# Patient Record
Sex: Female | Born: 1980 | Race: White | Hispanic: No | Marital: Married | Smoking: Never smoker
Health system: Southern US, Community
[De-identification: ages and names within clinical notes are randomized; demographics above are authoritative.]

## PROBLEM LIST (undated history)

## (undated) DIAGNOSIS — R112 Nausea with vomiting, unspecified: Secondary | ICD-10-CM

## (undated) DIAGNOSIS — E039 Hypothyroidism, unspecified: Secondary | ICD-10-CM

## (undated) DIAGNOSIS — N809 Endometriosis, unspecified: Secondary | ICD-10-CM

## (undated) DIAGNOSIS — Z9889 Other specified postprocedural states: Secondary | ICD-10-CM

## (undated) HISTORY — PX: CYSTECTOMY: SUR359

## (undated) HISTORY — PX: KNEE SURGERY: SHX244

## (undated) HISTORY — PX: HERNIA REPAIR: SHX51

---

## 2016-04-30 LAB — OB RESULTS CONSOLE ANTIBODY SCREEN: ANTIBODY SCREEN: NEGATIVE

## 2016-04-30 LAB — OB RESULTS CONSOLE ABO/RH: RH TYPE: POSITIVE

## 2016-04-30 LAB — OB RESULTS CONSOLE GC/CHLAMYDIA
Chlamydia: NEGATIVE
Gonorrhea: NEGATIVE

## 2016-04-30 LAB — OB RESULTS CONSOLE HIV ANTIBODY (ROUTINE TESTING): HIV: NONREACTIVE

## 2016-04-30 LAB — OB RESULTS CONSOLE RPR: RPR: NONREACTIVE

## 2016-04-30 LAB — OB RESULTS CONSOLE HEPATITIS B SURFACE ANTIGEN: HEP B S AG: NEGATIVE

## 2016-04-30 LAB — OB RESULTS CONSOLE RUBELLA ANTIBODY, IGM: RUBELLA: IMMUNE

## 2016-06-30 LAB — OB RESULTS CONSOLE GBS: STREP GROUP B AG: NEGATIVE

## 2016-07-14 ENCOUNTER — Encounter (HOSPITAL_COMMUNITY): Payer: Self-pay | Admitting: *Deleted

## 2016-07-21 NOTE — H&P (Addendum)
HPI: 35 y/o [id number] @ 172w4d estimated gestational age (as dated by LMP c/w 20 week ultrasound) presents for scheduled C-section and bilateral salpingectomy.   no Leaking of Fluid,   no Vaginal Bleeding,   no Uterine Contractions,  + Fetal Movement.  ROS: no HA, no epigastric pain, no visual changes.    Pregnancy complicated by: 1) GDMA1- well controlled with diet -followed by NST weekly -last US @ 34wk- vertex/EFW: 5#13oz (79%)    Prenatal Transfer Tool  Maternal Diabetes: Yes:  Diabetes Type:  Diet controlled Genetic Screening: Normal Maternal Ultrasounds/Referrals: Normal Fetal Ultrasounds or other Referrals:  None Maternal Substance Abuse:  No Significant Maternal Medications:  None Significant Maternal Lab Results: Lab values include: Group B Strep positive   PNL:  GBS neg, Rub Immune, Hep B neg, RPR NR, HIV neg, GC/C neg, glucola:abnormal Blood type: A positive antibody neg  Immunizations: Tdap: 06/24/16 Flu: 10/18  OBHx:  2011- Primary C-section, gestational HTN, 7#14oz 2013- SAB 2014- Repeat C-section, GDMA1, 8#10oz  PMHx:  Gestational DM and gestational HTN Meds:  PNV Allergy:   Allergies  Allergen Reactions  . Adhesive [Tape]   . Percocet [Oxycodone-Acetaminophen] Nausea And Vomiting   SurgHx: C-section x 2 SocHx:   no Tobacco, no  EtOH, no Illicit Drugs  O: Examination performed in office Gen. AAOx3, NAD CV.  RRR  Resp. Normal respiratory effort Abd. Gravid,  no tenderness,  no rigidity,  no guarding Extr.  no edema B/L , no calf tenderness bilaterally  FHT: NST reactive on 11/30   Labs: see orders  A/P:  35 y.o. [id number] @ 3172w4d EGA who presents for scheduled repeat C-section and bilateral salpingectomy -FWB:  Reassuring by doppler/NST -GDMA1- well controlled with diet, accucheck upon arrival -NPO -LR @ 125cc/hr -Ancef 2g IV to OR -SCDs to OR  Risk benefits and alternatives of cesarean section were discussed with the patient including but not  limited to infection, bleeding, damage to bowel , bladder and baby with the need for further surgery. Pt voiced understanding and desires to proceed.  Bilateral tubal ligation reviewed with R&B including but not limited to bleeding, infection, injury to other organs, irreversibility.  Questions answered.    Myna HidalgoJennifer Margeart Allender, DO [phone] (pager) [phone] (office)

## 2016-07-22 ENCOUNTER — Encounter (HOSPITAL_COMMUNITY)
Admission: RE | Admit: 2016-07-22 | Discharge: 2016-07-22 | Disposition: A | Discharge status: Home / Self Care | Payer: BLUE CROSS/BLUE SHIELD | Source: Ambulatory Visit | Attending: Obstetrics & Gynecology | Admitting: Obstetrics & Gynecology

## 2016-07-22 HISTORY — DX: Nausea with vomiting, unspecified: R11.2

## 2016-07-22 HISTORY — DX: Endometriosis, unspecified: N80.9

## 2016-07-22 HISTORY — DX: Other specified postprocedural states: Z98.890

## 2016-07-22 HISTORY — DX: Hypothyroidism, unspecified: E03.9

## 2016-07-22 LAB — BASIC METABOLIC PANEL
ANION GAP: 6 (ref 5–15)
BUN: 9 mg/dL (ref 6–20)
CHLORIDE: 103 mmol/L (ref 101–111)
CO2: 25 mmol/L (ref 22–32)
Calcium: 9.2 mg/dL (ref 8.9–10.3)
Creatinine, Ser: 0.57 mg/dL (ref 0.44–1.00)
GFR calc Af Amer: >60 mL/min (ref >60)
GFR calc non Af Amer: >60 mL/min (ref >60)
GLUCOSE: 108 mg/dL — AB (ref 65–99)
POTASSIUM: 4.3 mmol/L (ref 3.5–5.1)
SODIUM: 134 mmol/L — AB (ref 135–145)

## 2016-07-22 LAB — CBC
HCT: 34.5 % — ABNORMAL LOW (ref 36.0–46.0)
HEMOGLOBIN: 11.4 g/dL — AB (ref 12.0–15.0)
MCH: 27.9 pg (ref 26.0–34.0)
MCHC: 33 g/dL (ref 30.0–36.0)
MCV: 84.6 fL (ref 78.0–100.0)
Platelets: 186 10*3/uL (ref 150–400)
RBC: 4.08 MIL/uL (ref 3.87–5.11)
RDW: 15.3 % (ref 11.5–15.5)
WBC: 7.4 10*3/uL (ref 4.0–10.5)

## 2016-07-22 LAB — ABO/RH: ABO/RH(D): A POS

## 2016-07-22 NOTE — Patient Instructions (Signed)
[address] LawmanShannon Lunz  07/22/2016   Your procedure is scheduled on:  07/24/2016  [facility] through the [facility] of [facility] at 0845 AM.  Pick up the phone at the desk and dial [phone].   Call this number if you have problems the morning of surgery: [phone]   Remember:   Do not eat food:After Midnight.  Do not drink clear liquids: After Midnight.  Take these medicines the morning of surgery with A SIP OF WATER: synthroid   Do not wear jewelry, make-up or nail polish.  Do not wear lotions, powders, or perfumes. Do not wear deodorant.  Do not shave 48 hours prior to surgery.  Do not bring valuables to the hospital.  [facility] not   responsible for any belongings or valuables brought to the hospital.  Contacts, dentures or bridgework may not be worn into surgery.  Leave suitcase in the car. After surgery it may be brought to your room.  For patients admitted to the hospital, checkout time is 11:00 AM the day of              discharge.   Patients discharged the day of surgery will not be allowed to drive             home.  Name and phone number of your driver: na  Special Instructions:   N/A   Please read over the following fact sheets that you were given:   Surgical Site Infection Prevention

## 2016-07-23 ENCOUNTER — Encounter (HOSPITAL_COMMUNITY): Payer: BLUE CROSS/BLUE SHIELD

## 2016-07-23 LAB — RPR: RPR Ser Ql: NONREACTIVE

## 2016-07-24 ENCOUNTER — Encounter (HOSPITAL_COMMUNITY): Payer: Self-pay | Admitting: Anesthesiology

## 2016-07-24 ENCOUNTER — Inpatient Hospital Stay (HOSPITAL_COMMUNITY): Payer: BLUE CROSS/BLUE SHIELD | Admitting: Anesthesiology

## 2016-07-24 ENCOUNTER — Inpatient Hospital Stay (HOSPITAL_COMMUNITY)
Admission: RE | Admit: 2016-07-24 | Discharge: 2016-07-26 | DRG: 766 | Disposition: A | Discharge status: Home / Self Care | Payer: BLUE CROSS/BLUE SHIELD | Source: Ambulatory Visit | Attending: Obstetrics & Gynecology | Admitting: Obstetrics & Gynecology

## 2016-07-24 ENCOUNTER — Encounter (HOSPITAL_COMMUNITY)
Admission: RE | Disposition: A | Discharge status: Home / Self Care | Payer: Self-pay | Source: Ambulatory Visit | Attending: Obstetrics & Gynecology

## 2016-07-24 DIAGNOSIS — Z3490 Encounter for supervision of normal pregnancy, unspecified, unspecified trimester: Secondary | ICD-10-CM

## 2016-07-24 DIAGNOSIS — L259 Unspecified contact dermatitis, unspecified cause: Secondary | ICD-10-CM | POA: Diagnosis present

## 2016-07-24 DIAGNOSIS — Z302 Encounter for sterilization: Secondary | ICD-10-CM | POA: Diagnosis not present

## 2016-07-24 DIAGNOSIS — Z3493 Encounter for supervision of normal pregnancy, unspecified, third trimester: Secondary | ICD-10-CM

## 2016-07-24 DIAGNOSIS — O99284 Endocrine, nutritional and metabolic diseases complicating childbirth: Secondary | ICD-10-CM | POA: Diagnosis present

## 2016-07-24 DIAGNOSIS — Z3A39 39 weeks gestation of pregnancy: Secondary | ICD-10-CM | POA: Diagnosis not present

## 2016-07-24 DIAGNOSIS — O134 Gestational [pregnancy-induced] hypertension without significant proteinuria, complicating childbirth: Secondary | ICD-10-CM | POA: Diagnosis present

## 2016-07-24 DIAGNOSIS — E039 Hypothyroidism, unspecified: Secondary | ICD-10-CM | POA: Diagnosis present

## 2016-07-24 DIAGNOSIS — O24420 Gestational diabetes mellitus in childbirth, diet controlled: Secondary | ICD-10-CM | POA: Diagnosis present

## 2016-07-24 DIAGNOSIS — O99824 Streptococcus B carrier state complicating childbirth: Secondary | ICD-10-CM | POA: Diagnosis present

## 2016-07-24 DIAGNOSIS — O34211 Maternal care for low transverse scar from previous cesarean delivery: Secondary | ICD-10-CM | POA: Diagnosis present

## 2016-07-24 HISTORY — PX: TUBAL LIGATION: SHX77

## 2016-07-24 LAB — GLUCOSE, CAPILLARY
GLUCOSE-CAPILLARY: 93 mg/dL (ref 65–99)
Glucose-Capillary: 85 mg/dL (ref 65–99)

## 2016-07-24 LAB — PREPARE RBC (CROSSMATCH)

## 2016-07-24 SURGERY — Surgical Case
Anesthesia: Spinal | Site: Abdomen

## 2016-07-24 MED ORDER — KETOROLAC TROMETHAMINE 30 MG/ML IJ SOLN
30.0000 mg | Freq: Four times a day (QID) | INTRAMUSCULAR | Status: AC | PRN
  Administered 2016-07-24: 30 mg via INTRAVENOUS
  Filled 2016-07-24: qty 1

## 2016-07-24 MED ORDER — ONDANSETRON HCL 4 MG/2ML IJ SOLN
INTRAMUSCULAR | Status: AC
  Filled 2016-07-24: qty 2

## 2016-07-24 MED ORDER — NALBUPHINE HCL 10 MG/ML IJ SOLN: 5.0000 mg | Freq: Once | INTRAMUSCULAR | Status: DC | PRN

## 2016-07-24 MED ORDER — METOCLOPRAMIDE HCL 5 MG/ML IJ SOLN
INTRAMUSCULAR | Status: AC
  Filled 2016-07-24: qty 2

## 2016-07-24 MED ORDER — KETOROLAC TROMETHAMINE 30 MG/ML IJ SOLN
30.0000 mg | Freq: Four times a day (QID) | INTRAMUSCULAR | Status: AC | PRN
  Administered 2016-07-24: 30 mg via INTRAMUSCULAR

## 2016-07-24 MED ORDER — FENTANYL CITRATE (PF) 100 MCG/2ML IJ SOLN: 25.0000 ug | INTRAMUSCULAR | Status: DC | PRN

## 2016-07-24 MED ORDER — NALOXONE HCL 0.4 MG/ML IJ SOLN: 0.4000 mg | INTRAMUSCULAR | Status: DC | PRN

## 2016-07-24 MED ORDER — SIMETHICONE 80 MG PO CHEW
80.0000 mg | CHEWABLE_TABLET | Freq: Three times a day (TID) | ORAL | Status: DC
  Administered 2016-07-24 – 2016-07-26 (×6): 80 mg via ORAL
  Filled 2016-07-24 (×6): qty 1

## 2016-07-24 MED ORDER — NALBUPHINE HCL 10 MG/ML IJ SOLN: 5.0000 mg | INTRAMUSCULAR | Status: DC | PRN

## 2016-07-24 MED ORDER — KETOROLAC TROMETHAMINE 30 MG/ML IJ SOLN
INTRAMUSCULAR | Status: AC
  Filled 2016-07-24: qty 1

## 2016-07-24 MED ORDER — PRENATAL MULTIVITAMIN CH
1.0000 | ORAL_TABLET | Freq: Every day | ORAL | Status: DC
  Administered 2016-07-25 – 2016-07-26 (×2): 1 via ORAL
  Filled 2016-07-24 (×2): qty 1

## 2016-07-24 MED ORDER — METOCLOPRAMIDE HCL 5 MG/ML IJ SOLN
INTRAMUSCULAR | Status: DC | PRN
  Administered 2016-07-24 (×2): 5 mg via INTRAVENOUS

## 2016-07-24 MED ORDER — LEVOTHYROXINE SODIUM 75 MCG PO TABS
37.5000 ug | ORAL_TABLET | Freq: Every day | ORAL | Status: DC
  Administered 2016-07-25 – 2016-07-26 (×2): 37.5 ug via ORAL
  Filled 2016-07-24 (×3): qty 0.5

## 2016-07-24 MED ORDER — IBUPROFEN 600 MG PO TABS
600.0000 mg | ORAL_TABLET | Freq: Four times a day (QID) | ORAL | Status: DC | PRN
  Administered 2016-07-24: 600 mg via ORAL

## 2016-07-24 MED ORDER — ZOLPIDEM TARTRATE 5 MG PO TABS: 5.0000 mg | ORAL_TABLET | Freq: Every evening | ORAL | Status: DC | PRN

## 2016-07-24 MED ORDER — SIMETHICONE 80 MG PO CHEW
80.0000 mg | CHEWABLE_TABLET | ORAL | Status: DC
  Administered 2016-07-24 – 2016-07-25 (×2): 80 mg via ORAL
  Filled 2016-07-24 (×2): qty 1

## 2016-07-24 MED ORDER — OXYTOCIN 40 UNITS IN LACTATED RINGERS INFUSION - SIMPLE MED: 2.5000 [IU]/h | INTRAVENOUS | Status: AC

## 2016-07-24 MED ORDER — SCOPOLAMINE 1 MG/3DAYS TD PT72
1.0000 | MEDICATED_PATCH | Freq: Once | TRANSDERMAL | Status: DC
  Administered 2016-07-24: 1.5 mg via TRANSDERMAL

## 2016-07-24 MED ORDER — DIPHENHYDRAMINE HCL 50 MG/ML IJ SOLN: 12.5000 mg | INTRAMUSCULAR | Status: DC | PRN

## 2016-07-24 MED ORDER — FENTANYL CITRATE (PF) 100 MCG/2ML IJ SOLN
INTRAMUSCULAR | Status: AC
  Filled 2016-07-24: qty 2

## 2016-07-24 MED ORDER — PHENYLEPHRINE 8 MG IN D5W 100 ML (0.08MG/ML) PREMIX OPTIME
INJECTION | INTRAVENOUS | Status: AC
  Filled 2016-07-24: qty 100

## 2016-07-24 MED ORDER — SENNOSIDES-DOCUSATE SODIUM 8.6-50 MG PO TABS
2.0000 | ORAL_TABLET | ORAL | Status: DC
  Administered 2016-07-24 – 2016-07-25 (×2): 2 via ORAL
  Filled 2016-07-24 (×2): qty 2

## 2016-07-24 MED ORDER — MEPERIDINE HCL 25 MG/ML IJ SOLN
INTRAMUSCULAR | Status: AC
  Filled 2016-07-24: qty 1

## 2016-07-24 MED ORDER — LACTATED RINGERS IV SOLN: INTRAVENOUS | Status: DC

## 2016-07-24 MED ORDER — IBUPROFEN 600 MG PO TABS
600.0000 mg | ORAL_TABLET | Freq: Four times a day (QID) | ORAL | Status: DC
  Administered 2016-07-25 – 2016-07-26 (×6): 600 mg via ORAL
  Filled 2016-07-24 (×7): qty 1

## 2016-07-24 MED ORDER — DIPHENHYDRAMINE HCL 25 MG PO CAPS
25.0000 mg | ORAL_CAPSULE | ORAL | Status: DC | PRN
  Administered 2016-07-25 – 2016-07-26 (×3): 25 mg via ORAL
  Filled 2016-07-24 (×3): qty 1

## 2016-07-24 MED ORDER — MEPERIDINE HCL 25 MG/ML IJ SOLN: 6.2500 mg | INTRAMUSCULAR | Status: DC | PRN

## 2016-07-24 MED ORDER — LACTATED RINGERS IV SOLN
INTRAVENOUS | Status: DC
  Administered 2016-07-24 (×2): via INTRAVENOUS

## 2016-07-24 MED ORDER — ACETAMINOPHEN 325 MG PO TABS
650.0000 mg | ORAL_TABLET | ORAL | Status: DC | PRN
  Administered 2016-07-25 (×2): 650 mg via ORAL
  Filled 2016-07-24 (×3): qty 2

## 2016-07-24 MED ORDER — DIPHENHYDRAMINE HCL 50 MG/ML IJ SOLN
INTRAMUSCULAR | Status: AC
  Administered 2016-07-24: 12.5 mg via INTRAVENOUS
  Filled 2016-07-24: qty 1

## 2016-07-24 MED ORDER — NALOXONE HCL 2 MG/2ML IJ SOSY
1.0000 ug/kg/h | PREFILLED_SYRINGE | INTRAVENOUS | Status: DC | PRN
  Filled 2016-07-24: qty 2

## 2016-07-24 MED ORDER — FENTANYL CITRATE (PF) 100 MCG/2ML IJ SOLN
INTRAMUSCULAR | Status: DC | PRN
Start: 2016-07-24 — End: 2016-07-24
  Administered 2016-07-24: 20 ug via INTRATHECAL

## 2016-07-24 MED ORDER — MORPHINE SULFATE (PF) 0.5 MG/ML IJ SOLN
INTRAMUSCULAR | Status: DC | PRN
  Administered 2016-07-24: .2 mg via INTRATHECAL

## 2016-07-24 MED ORDER — PHENYLEPHRINE 8 MG IN D5W 100 ML (0.08MG/ML) PREMIX OPTIME
INJECTION | INTRAVENOUS | Status: DC | PRN
  Administered 2016-07-24: 40 ug/min via INTRAVENOUS

## 2016-07-24 MED ORDER — PHENYLEPHRINE HCL 10 MG/ML IJ SOLN
INTRAMUSCULAR | Status: DC | PRN
  Administered 2016-07-24: 40 ug via INTRAVENOUS

## 2016-07-24 MED ORDER — BUPIVACAINE IN DEXTROSE 0.75-8.25 % IT SOLN
INTRATHECAL | Status: DC | PRN
  Administered 2016-07-24: 1.5 mL via INTRATHECAL

## 2016-07-24 MED ORDER — MEPERIDINE HCL 25 MG/ML IJ SOLN
INTRAMUSCULAR | Status: DC | PRN
  Administered 2016-07-24 (×2): 12.5 mg via INTRAVENOUS

## 2016-07-24 MED ORDER — MENTHOL 3 MG MT LOZG: 1.0000 | LOZENGE | OROMUCOSAL | Status: DC | PRN

## 2016-07-24 MED ORDER — OXYTOCIN 10 UNIT/ML IJ SOLN
INTRAMUSCULAR | Status: AC
  Filled 2016-07-24: qty 4

## 2016-07-24 MED ORDER — SIMETHICONE 80 MG PO CHEW: 80.0000 mg | CHEWABLE_TABLET | ORAL | Status: DC | PRN

## 2016-07-24 MED ORDER — SCOPOLAMINE 1 MG/3DAYS TD PT72
MEDICATED_PATCH | TRANSDERMAL | Status: AC
  Administered 2016-07-24: 1.5 mg via TRANSDERMAL
  Filled 2016-07-24: qty 1

## 2016-07-24 MED ORDER — DIPHENHYDRAMINE HCL 25 MG PO CAPS
25.0000 mg | ORAL_CAPSULE | Freq: Four times a day (QID) | ORAL | Status: DC | PRN
  Administered 2016-07-24: 25 mg via ORAL
  Filled 2016-07-24 (×2): qty 1

## 2016-07-24 MED ORDER — ONDANSETRON HCL 4 MG/2ML IJ SOLN: 4.0000 mg | Freq: Three times a day (TID) | INTRAMUSCULAR | Status: DC | PRN

## 2016-07-24 MED ORDER — ONDANSETRON HCL 4 MG/2ML IJ SOLN
INTRAMUSCULAR | Status: DC | PRN
  Administered 2016-07-24: 4 mg via INTRAVENOUS

## 2016-07-24 MED ORDER — OXYTOCIN 10 UNIT/ML IJ SOLN
INTRAVENOUS | Status: DC | PRN
  Administered 2016-07-24: 40 [IU] via INTRAVENOUS

## 2016-07-24 MED ORDER — MORPHINE SULFATE-NACL 0.5-0.9 MG/ML-% IV SOSY
PREFILLED_SYRINGE | INTRAVENOUS | Status: AC
  Filled 2016-07-24: qty 1

## 2016-07-24 MED ORDER — DIBUCAINE 1 % RE OINT: 1.0000 "application " | TOPICAL_OINTMENT | RECTAL | Status: DC | PRN

## 2016-07-24 MED ORDER — CEFAZOLIN SODIUM-DEXTROSE 2-4 GM/100ML-% IV SOLN
2.0000 g | INTRAVENOUS | Status: AC
  Administered 2016-07-24: 2 g via INTRAVENOUS

## 2016-07-24 MED ORDER — LACTATED RINGERS IV SOLN
Freq: Once | INTRAVENOUS | Status: AC
  Administered 2016-07-24: 10:00:00 via INTRAVENOUS

## 2016-07-24 MED ORDER — COCONUT OIL OIL: 1.0000 "application " | TOPICAL_OIL | Status: DC | PRN

## 2016-07-24 MED ORDER — SODIUM CHLORIDE 0.9% FLUSH: 3.0000 mL | INTRAVENOUS | Status: DC | PRN

## 2016-07-24 MED ORDER — WITCH HAZEL-GLYCERIN EX PADS: 1.0000 "application " | MEDICATED_PAD | CUTANEOUS | Status: DC | PRN

## 2016-07-24 MED ORDER — TETANUS-DIPHTH-ACELL PERTUSSIS 5-2.5-18.5 LF-MCG/0.5 IM SUSP: 0.5000 mL | Freq: Once | INTRAMUSCULAR | Status: DC

## 2016-07-24 MED ORDER — LACTATED RINGERS IV SOLN
INTRAVENOUS | Status: DC
  Administered 2016-07-24 (×2): via INTRAVENOUS

## 2016-07-24 SURGICAL SUPPLY — 44 items
BARRIER ADHS 3X4 INTERCEED (GAUZE/BANDAGES/DRESSINGS) ×4 IMPLANT
BENZOIN TINCTURE PRP APPL 2/3 (GAUZE/BANDAGES/DRESSINGS) ×4 IMPLANT
BINDER ABD UNIV 9 30-45 (GAUZE/BANDAGES/DRESSINGS) ×2 IMPLANT
BINDER ABDOMINAL 9 (GAUZE/BANDAGES/DRESSINGS) ×4
CHLORAPREP W/TINT 26ML (MISCELLANEOUS) ×4 IMPLANT
CLAMP CORD UMBIL (MISCELLANEOUS) IMPLANT
CLOSURE WOUND 1/2 X4 (GAUZE/BANDAGES/DRESSINGS) ×1
CLOTH BEACON ORANGE TIMEOUT ST (SAFETY) ×4 IMPLANT
DERMABOND ADHESIVE PROPEN (GAUZE/BANDAGES/DRESSINGS) ×2
DERMABOND ADVANCED (GAUZE/BANDAGES/DRESSINGS)
DERMABOND ADVANCED .7 DNX12 (GAUZE/BANDAGES/DRESSINGS) IMPLANT
DERMABOND ADVANCED .7 DNX6 (GAUZE/BANDAGES/DRESSINGS) ×2 IMPLANT
DRSG OPSITE POSTOP 4X10 (GAUZE/BANDAGES/DRESSINGS) ×4 IMPLANT
ELECT REM PT RETURN 9FT ADLT (ELECTROSURGICAL) ×4
ELECTRODE REM PT RTRN 9FT ADLT (ELECTROSURGICAL) ×2 IMPLANT
EXTRACTOR VACUUM KIWI (MISCELLANEOUS) IMPLANT
GLOVE BIOGEL PI IND STRL 6.5 (GLOVE) ×2 IMPLANT
GLOVE BIOGEL PI IND STRL 7.0 (GLOVE) ×4 IMPLANT
GLOVE BIOGEL PI INDICATOR 6.5 (GLOVE) ×2
GLOVE BIOGEL PI INDICATOR 7.0 (GLOVE) ×4
GLOVE ECLIPSE 6.5 STRL STRAW (GLOVE) ×4 IMPLANT
GOWN STRL REUS W/TWL LRG LVL3 (GOWN DISPOSABLE) ×12 IMPLANT
HEMOSTAT ARISTA ABSORB 3G PWDR (MISCELLANEOUS) ×4 IMPLANT
KIT ABG SYR 3ML LUER SLIP (SYRINGE) IMPLANT
NEEDLE HYPO 25X5/8 SAFETYGLIDE (NEEDLE) IMPLANT
NS IRRIG 1000ML POUR BTL (IV SOLUTION) ×4 IMPLANT
PACK C SECTION WH (CUSTOM PROCEDURE TRAY) ×4 IMPLANT
PAD ABD 7.5X8 STRL (GAUZE/BANDAGES/DRESSINGS) ×4 IMPLANT
PAD OB MATERNITY 4.3X12.25 (PERSONAL CARE ITEMS) ×4 IMPLANT
PENCIL SMOKE EVAC W/HOLSTER (ELECTROSURGICAL) ×4 IMPLANT
RTRCTR C-SECT PINK 25CM LRG (MISCELLANEOUS) ×4 IMPLANT
SPONGE GAUZE 4X4 12PLY (GAUZE/BANDAGES/DRESSINGS) ×4 IMPLANT
STRIP CLOSURE SKIN 1/2X4 (GAUZE/BANDAGES/DRESSINGS) ×3 IMPLANT
SUT PLAIN 0 NONE (SUTURE) ×8 IMPLANT
SUT PLAIN 2 0 XLH (SUTURE) IMPLANT
SUT VIC AB 0 CT1 27 (SUTURE) ×4
SUT VIC AB 0 CT1 27XBRD ANBCTR (SUTURE) ×4 IMPLANT
SUT VIC AB 0 CTX 36 (SUTURE) ×6
SUT VIC AB 0 CTX36XBRD ANBCTRL (SUTURE) ×6 IMPLANT
SUT VIC AB 2-0 CT1 27 (SUTURE) ×4
SUT VIC AB 2-0 CT1 TAPERPNT 27 (SUTURE) ×4 IMPLANT
SUT VIC AB 4-0 KS 27 (SUTURE) ×4 IMPLANT
TOWEL OR 17X24 6PK STRL BLUE (TOWEL DISPOSABLE) ×4 IMPLANT
TRAY FOLEY CATH SILVER 14FR (SET/KITS/TRAYS/PACK) IMPLANT

## 2016-07-24 NOTE — Anesthesia Preprocedure Evaluation (Addendum)
Anesthesia Evaluation  Patient identified by MRN, date of birth, ID band Patient awake    Reviewed: Allergy & Precautions, NPO status , Patient's Chart, lab work & pertinent test results  History of Anesthesia Complications (+) PONV and history of anesthetic complications  Airway Mallampati: I  TM Distance: >3 FB Neck ROM: Full    Dental no notable dental hx. (+) Teeth Intact   Pulmonary neg pulmonary ROS,    Pulmonary exam normal breath sounds clear to auscultation       Cardiovascular negative cardio ROS Normal cardiovascular exam Rhythm:Regular Rate:Normal     Neuro/Psych negative neurological ROS  negative psych ROS   GI/Hepatic Neg liver ROS,   Endo/Other  Hypothyroidism   Renal/GU negative Renal ROS  negative genitourinary   Musculoskeletal negative musculoskeletal ROS (+)   Abdominal   Peds  Hematology  (+) anemia ,   Anesthesia Other Findings   Reproductive/Obstetrics (+) Pregnancy Previous C/Section x 2 Desires sterilization                            Lab Results  Component Value Date   WBC 7.4 07/22/2016   HGB 11.4 (L) 07/22/2016   HCT 34.5 (L) 07/22/2016   MCV 84.6 07/22/2016   PLT 186 07/22/2016    Anesthesia Physical Anesthesia Plan  ASA: II  Anesthesia Plan: Spinal   Post-op Pain Management:    Induction:   Airway Management Planned: Natural Airway  Additional Equipment:   Intra-op Plan:   Post-operative Plan:   Informed Consent: I have reviewed the patients History and Physical, chart, labs and discussed the procedure including the risks, benefits and alternatives for the proposed anesthesia with the patient or authorized representative who has indicated his/her understanding and acceptance.   Dental advisory given  Plan Discussed with: Anesthesiologist, CRNA and Surgeon  Anesthesia Plan Comments:         Anesthesia Quick Evaluation

## 2016-07-24 NOTE — Anesthesia Procedure Notes (Signed)
Spinal  Patient location during procedure: OR Start time: 07/24/2016 10:14 AM Staffing Anesthesiologist: Mal AmabileFOSTER, Aleni Andrus Performed: anesthesiologist  Preanesthetic Checklist Completed: patient identified, site marked, surgical consent, pre-op evaluation, timeout performed, IV checked, risks and benefits discussed and monitors and equipment checked Spinal Block Patient position: sitting Prep: site prepped and draped and DuraPrep Patient monitoring: heart rate, cardiac monitor, continuous pulse ox and blood pressure Approach: midline Location: L3-4 Injection technique: single-shot Needle Needle type: Pencan  Needle gauge: 24 G Needle length: 9 cm Needle insertion depth: 4 cm Assessment Sensory level: T4 Additional Notes Patient tolerated procedure well. Adequate sensory level.

## 2016-07-24 NOTE — Addendum Note (Signed)
Addendum  created 07/24/16 1920 by Yolonda KidaAlison L Arlyce Circle, CRNA   Sign clinical note

## 2016-07-24 NOTE — Transfer of Care (Signed)
Immediate Anesthesia Transfer of Care Note  Patient: Mikayla Black  Procedure(s) Performed: Procedure(s): CESAREAN SECTION WITH BILATERAL SALPINGECTOMY (N/A)  Patient Location: PACU  Anesthesia Type:Spinal  Level of Consciousness: awake, alert , oriented and patient cooperative  Airway & Oxygen Therapy: Patient Spontanous Breathing  Post-op Assessment: Report given to RN and Post -op Vital signs reviewed and stable  Post vital signs: Reviewed and stable  Last Vitals:  Vitals:   07/24/16 0920  BP: 126/89  Pulse: 97  Resp: 18  Temp: 36.9 C    Last Pain:  Vitals:   07/24/16 0920  TempSrc: Oral      Patients Stated Pain Goal: 3 (07/24/16 0920)  Complications: No apparent anesthesia complications

## 2016-07-24 NOTE — Interval H&P Note (Signed)
History and Physical Interval Note:  07/24/2016 9:50 AM  Mikayla Black  has presented today for surgery, with the diagnosis of  History of C-Section  The various methods of treatment have been discussed with the patient and family. After consideration of risks, benefits and other options for treatment, the patient has consented to  Procedure(s): CESAREAN SECTION WITH BILATERAL SALPINGECTOMY (N/A) as a surgical intervention .  The patient's history has been reviewed, patient examined, no change in status, stable for surgery.  I have reviewed the patient's chart and labs.  Questions were answered to the patient's satisfaction.     Myna HidalgoZAN, Willoughby Doell, M

## 2016-07-24 NOTE — Op Note (Signed)
PreOp Diagnosis: 1) Intrauterine pregnancy @ 5674w4d 2) GDMA1 3) Desire for permanent sterilization PostOp Diagnosis: same Procedure: Repeat LTCS and bilateral tubal ligation Surgeon: Dr. Myna HidalgoJennifer Barbaraann Avans Assistant: Dr. Marylou FlesherBenita Varnado Anesthesia: epidural Complications: none EBL: 800cc UOP: 200cc Fluids: 2400cc  Findings: Female infant from vertex presentation, anterior placenta.  Adhesions noted between the uterus and anterior abdominal wall, adnexal adhesions between the fallopian tubes ovaries and side wall.  PROCEDURE:  Informed consent was obtained from the patient with risks, benefits, complications, treatment options, and expected outcomes discussed with the patient.  The patient concurred with the proposed plan, giving informed consent with form signed.   The patient was taken to Operating Room, and identified with the procedure verified as C-Section Delivery with Time Out. With induction of anesthesia, the patient was prepped and draped in the usual sterile fashion. A Pfannenstiel incision was made and carried down through the subcutaneous tissue to the fascia. The fascia was incised in the midline and extended transversely. The superior aspect of the fascial incision was grasped with Kochers elevated and the underlying muscle dissected off. The inferior aspect of the facial incision was in similar fashion, grasped elevated and rectus muscles dissected off. The peritoneum was identified and entered. Peritoneal incision was extended longitudinally. The utero-vesical peritoneal reflection was identified and incised transversely with the [facility] sc[facility], the incision extended laterally, the bladder flap created digitally. The lower uterine segment was noted to be very thin.  A low transverse uterine incision was made and the infants head delivered atraumatically. After the umbilical cord was clamped and cut cord blood was obtained for evaluation.   The placenta was removed intact and appeared normal.  The uterine outline, tubes and ovaries were noted to have the adhesions as outlined above.  The uterine incision was closed with running locked sutures of 0 Vicryl.  Interrupted stitches were used to obtain hemostasis.    Attention was turned to the fallopian tubes.  Due to the adhesions, a salpingectomy was not able to be completed. Attention was turned to the right side, where the right fallopian tube was  grasped with Babcock forceps until the fimbria was visualized. Individual ties of 0 plain catgut suture were used to ligate a small portion of the fallopian tube.   Hemostasis was achieved with the bovie. An identical procedure was carried out on the opposite side. Again hemostasis was adequate. Attention was turned back to the uterus, due to the thin lower uterine segment a hematoma was noted.  The area was imbricated with interrupted 2-0 vicryl.  Adequate hemostasis was obtained. The pericolic gutters were then cleared of all clots and debris. Arista was placed as well as Interceed to prevent further adhesions.  The Alexis retractor was removed.  The fascia was closed using a running suture of 0 Vicryl. The skin was reapproximated using a subcuticular suture of 4-0 vicryl. Sponge, needle, and instrument counts were correct. The procedure is well tolerated by the patient who is taken to recovery room in a well and stable condition.  Specimen: 2 segments of tube sent to pathology.  Myna HidalgoJennifer Nadean Montanaro, DO [phone] (pager) [phone] (office)

## 2016-07-24 NOTE — Lactation Note (Signed)
This note was copied from a baby's chart. Lactation Consultation Note  Patient Name: Mikayla Black ZOXWR'UToday's Date: 07/24/2016 Reason for consult: Initial assessment   Initial consult with Exp BF mom of 1 hour old infant in PACU. Mom reports she BF her 3 and 346 yo for 1 year each.   Mom with small breasts with everted nipples. Infant was STS with mom. He was sleepy with minimal interest in feeding. He rooted briefly and suckled for a few sucks x 2, he then went back to sleep. Enc mom to feed infant STS 8-12 x in 24 hours at first feeding cues. Mom reports that she is aware of how to hand express.   Feeding log given with instructions for use. BF Resources Handout and LC Brochure given, mom informed of IP/OP Services, BF Support Groups and LC phone #. Enc mom to call out to desk for feeding assistance as needed.   Mom reports she has a Medela PIS at home for use. Follow up tomorrow and prn.   Maternal Data Formula Feeding for Exclusion: No Has patient been taught Hand Expression?: Yes Does the patient have breastfeeding experience prior to this delivery?: Yes  Feeding Feeding Type: Breast Fed Length of feed: 1 min  LATCH Score/Interventions Latch: Too sleepy or reluctant, no latch achieved, no sucking elicited.  Audible Swallowing: None  Type of Nipple: Everted at rest and after stimulation  Comfort (Breast/Nipple): Soft / non-tender     Hold (Positioning): Assistance needed to correctly position infant at breast and maintain latch. Intervention(s): Breastfeeding basics reviewed;Support Pillows;Skin to skin  LATCH Score: 5  Lactation Tools Discussed/Used WIC Program: No   Consult Status Consult Status: Follow-up Date: 07/25/16 Follow-up type: In-patient    Silas FloodSharon S Livier Hendel 07/24/2016, 12:19 PM

## 2016-07-24 NOTE — Anesthesia Postprocedure Evaluation (Signed)
Anesthesia Post Note  Patient: Mikayla Black  Procedure(s) Performed: Procedure(s) (LRB): CESAREAN SECTION (N/A) BILATERAL TUBAL LIGATION (Bilateral)  Patient location during evaluation: Mother Baby Anesthesia Type: Spinal Level of consciousness: awake, awake and alert, oriented and patient cooperative Pain management: pain level controlled Vital Signs Assessment: post-procedure vital signs reviewed and stable Respiratory status: spontaneous breathing, nonlabored ventilation and respiratory function stable Cardiovascular status: stable Postop Assessment: patient able to bend at knees, no headache, no backache and no signs of nausea or vomiting Anesthetic complications: no     Last Vitals:  Vitals:   07/24/16 1518 07/24/16 1635  BP: 126/74 130/77  Pulse: 79 72  Resp: 19 20  Temp: 36.8 C 36.9 C    Last Pain:  Vitals:   07/24/16 1750  TempSrc:   PainSc: 0-No pain   Pain Goal: Patients Stated Pain Goal: 3 (07/24/16 0920)               [name]

## 2016-07-24 NOTE — Anesthesia Postprocedure Evaluation (Signed)
Anesthesia Post Note  Patient: Mikayla Black  Procedure(s) Performed: Procedure(s) (LRB): CESAREAN SECTION WITH BILATERAL SALPINGECTOMY (N/A)  Patient location during evaluation: PACU Anesthesia Type: Spinal Level of consciousness: awake and alert and oriented Pain management: pain level controlled Vital Signs Assessment: post-procedure vital signs reviewed and stable Respiratory status: spontaneous breathing, nonlabored ventilation and respiratory function stable Cardiovascular status: blood pressure returned to baseline and stable Postop Assessment: no signs of nausea or vomiting, spinal receding, no backache, no headache and patient able to bend at knees Anesthetic complications: no     Last Vitals:  Vitals:   07/24/16 1152 07/24/16 1200  BP: 127/78 126/86  Pulse: 78 76  Resp: (!) 22 14  Temp: 36.5 C     Last Pain:  Vitals:   07/24/16 1152  TempSrc: Oral   Pain Goal: Patients Stated Pain Goal: 3 (07/24/16 0920)               Nihaal Friesen A.

## 2016-07-25 LAB — CBC
HCT: 28.5 % — ABNORMAL LOW (ref 36.0–46.0)
Hemoglobin: 9.6 g/dL — ABNORMAL LOW (ref 12.0–15.0)
MCH: 27.7 pg (ref 26.0–34.0)
MCHC: 33.7 g/dL (ref 30.0–36.0)
MCV: 82.4 fL (ref 78.0–100.0)
Platelets: 164 10*3/uL (ref 150–400)
RBC: 3.46 MIL/uL — ABNORMAL LOW (ref 3.87–5.11)
RDW: 15.5 % (ref 11.5–15.5)
WBC: 9.5 10*3/uL (ref 4.0–10.5)

## 2016-07-25 MED ORDER — HYDROCORTISONE 1 % EX CREA
TOPICAL_CREAM | Freq: Four times a day (QID) | CUTANEOUS | Status: DC | PRN
  Filled 2016-07-25: qty 28

## 2016-07-25 MED ORDER — FERROUS SULFATE 325 (65 FE) MG PO TABS
325.0000 mg | ORAL_TABLET | Freq: Every day | ORAL | Status: DC
  Administered 2016-07-25 – 2016-07-26 (×2): 325 mg via ORAL
  Filled 2016-07-25 (×2): qty 1

## 2016-07-25 NOTE — Progress Notes (Addendum)
Postpartum Note Day # 1  S:  Patient resting comfortable in bed.  Pain controlled.  Tolerating general. + flatus, no BM.  Lochia appropriate.  Ambulating without difficulty.  She denies n/v/f/c, SOB, or CP.  Pt plans on breastfeeding.  O: Temp:  [97.7 F (36.5 C)-99.6 F (37.6 C)] 98.2 F (36.8 C) (12/02 0430) Pulse Rate:  [72-97] 85 (12/02 0430) Resp:  [11-29] 18 (12/02 0430) BP: (100-133)/(56-94) 106/76 (12/02 0430) SpO2:  [96 %-100 %] 96 % (12/01 1518)   Gen: A&Ox3, NAD CV: RRR Resp: CTAB Abdomen: soft, NT, ND +BS Uterus: firm, non-tender, below umbilicus Incision: c/d/i with dermabond Ext: No edema, no calf tenderness bilaterally, SCDs in place  Labs:  Recent Labs  07/22/16 1050 07/25/16 0534  HGB 11.4* 9.6*    A/P: Pt is a 35 y.o. [id number] s/p Repeat C-section with tubal ligation, POD#1  - Pain well controlled -GU: UOP is adequate -GI: Tolerating general diet -Activity: encouraged sitting up to chair and ambulation as tolerated -Prophylaxis: SCDs in place, encourage ambulation -Labs: Hgb appropriate for recent surgery, pt asymptomatic, iron daily -Hypothyroidism: continue home medication q am -Baby Boy Circ- concern from PEDS- will discuss with them today.  Mostly likely will delay.  DISPO: Continue with routine postpartum care.  Myna HidalgoJennifer Vickey Boak, DO [phone] (pager) [phone] (office)

## 2016-07-25 NOTE — Lactation Note (Signed)
This note was copied from a baby's chart. Lactation Consultation Note  Patient Name: Mikayla Black ZOXWR'UToday's Date: 07/25/2016 Reason for consult: Follow-up assessment   With this experienced breast feeding mom and term baby. Mom denies any questions/conerns of rneed for lactation at this time. Baby latching well, good wet and dirty diapers. Mom knows to call for questions/conerns  Maternal Data    Feeding Feeding Type: Breast Fed Length of feed: 10 min  LATCH Score/Interventions                      Lactation Tools Discussed/Used     Consult Status Consult Status: Complete Follow-up type: Call as needed    Alfred LevinsLee, Taniah Reinecke Anne 07/25/2016, 5:24 PM

## 2016-07-26 LAB — TYPE AND SCREEN
ABO/RH(D): A POS
Antibody Screen: NEGATIVE
UNIT DIVISION: 0
Unit division: 0

## 2016-07-26 MED ORDER — IBUPROFEN 600 MG PO TABS: 600.0000 mg | ORAL_TABLET | Freq: Four times a day (QID) | ORAL | 0 refills | Status: AC | PRN

## 2016-07-26 MED ORDER — HYDROCORTISONE 1 % EX CREA: TOPICAL_CREAM | Freq: Four times a day (QID) | CUTANEOUS | 0 refills | Status: AC | PRN

## 2016-07-26 NOTE — Discharge Summary (Signed)
OB Discharge Summary     Patient Name: Mikayla LawmanShannon Black DOB: 02/25/1981 MRN: [id number]  Date of admission: 07/24/2016 Delivering MD: Myna HidalgoZAN, Emlyn Maves   Date of discharge: 07/26/2016  Admitting diagnosis: Z98.891 History of C-Section Intrauterine pregnancy: 5966w4d     Secondary diagnosis:  Active Problems:   Pregnancy   Normal intrauterine pregnancy in third trimester  Additional problems: Hypothyroidism,GDMA1     Discharge diagnosis: Term Pregnancy Delivered and GDM A1                                                                                                Post partum procedures:none  Augmentation: N/A  Complications: None  Hospital course:  Sceduled C/S   35 y.o. yo G4P1001 at 4166w4d was admitted to the hospital 07/24/2016 for scheduled cesarean section with the following indication:Elective Repeat.  Membrane Rupture Time/Date: 10:41 AM ,07/24/2016   Patient delivered a Viable infant.07/24/2016  Details of operation can be found in separate operative note.  Pateint had an uncomplicated postpartum course.  She is ambulating, tolerating a regular diet, passing flatus, and urinating well. Patient is discharged home in stable condition on  07/26/16          Physical exam Vitals:   07/25/16 0030 07/25/16 0430 07/25/16 1827 07/26/16 0556  BP: (!) 100/56 106/76 113/72 99/68  Pulse: 77 85 85 78  Resp: 18 18 14 16   Temp: 98.4 F (36.9 C) 98.2 F (36.8 C) 98.2 F (36.8 C) 97.6 F (36.4 C)  TempSrc: Oral Oral Oral Oral  SpO2:   99%    General: alert, cooperative and no distress Lochia: appropriate Uterine Fundus: firm Incision: Healing well with no significant drainage DVT Evaluation: No evidence of DVT seen on physical exam. Labs: Lab Results  Component Value Date   WBC 9.5 07/25/2016   HGB 9.6 (L) 07/25/2016   HCT 28.5 (L) 07/25/2016   MCV 82.4 07/25/2016   PLT 164 07/25/2016   CMP Latest Ref Rng & Units 07/22/2016  Glucose 65 - 99 mg/dL 409(W108(H)  BUN 6 - 20 mg/dL 9   Creatinine 1.190.44 - 1.471.00 mg/dL 8.290.57  Sodium 562135 - 130145 mmol/L 134(L)  Potassium 3.5 - 5.1 mmol/L 4.3  Chloride 101 - 111 mmol/L 103  CO2 22 - 32 mmol/L 25  Calcium 8.9 - 10.3 mg/dL 9.2    Discharge instruction: per After Visit Summary and "Baby and Me Booklet".  After visit meds:    Medication List    STOP taking these medications   multivitamin-prenatal 27-0.8 MG Tabs tablet     TAKE these medications   ALLERGY EYE DROPS OP Apply 2 drops to eye 2 (two) times daily as needed (For allergies.).   calcium carbonate 500 MG chewable tablet Commonly known as:  TUMS - dosed in mg elemental calcium Chew 1 tablet by mouth 2 (two) times daily as needed for indigestion or heartburn.   cetirizine HCl 5 MG/5ML Syrp Commonly known as:  Zyrtec Take 5 mg by mouth daily as needed for allergies.   hydrocortisone cream 1 % Apply topically 4 (four) times daily as needed  for itching.   ibuprofen 600 MG tablet Commonly known as:  ADVIL,MOTRIN Take 1 tablet (600 mg total) by mouth every 6 (six) hours as needed for mild pain.   levothyroxine 75 MCG tablet Commonly known as:  SYNTHROID, LEVOTHROID Take 37.5 mcg by mouth daily before breakfast.       Diet: routine diet  Activity: Advance as tolerated. Pelvic rest for 6 weeks.   Outpatient follow up:2 weeks Follow up Appt:No future appointments. Follow up Visit:No Follow-up on file.  Postpartum contraception: Tubal Ligation  Newborn Data: Live born female  Birth Weight: 8 lb 12.2 oz (3975 g) APGAR: 8, 9  Baby Feeding: Breast Disposition:home with mother   07/26/2016 Myna HidalgoZAN, Shamanda Len, M, DO

## 2016-07-26 NOTE — Discharge Instructions (Signed)
Cesarean Delivery, Care After Refer to this sheet in the next few weeks. These instructions provide you with information about caring for yourself after your procedure. Your health care provider may also give you more specific instructions. Your treatment has been planned according to current medical practices, but problems sometimes occur. Call your health care provider if you have any problems or questions after your procedure. What can I expect after the procedure? After the procedure, it is common to have:  A small amount of blood or clear fluid coming from the incision.  Some redness, swelling, and pain in your incision area.  Some abdominal pain and soreness.  Vaginal bleeding (lochia).  Pelvic cramps.  Fatigue. Follow these instructions at home: Incision care  Follow instructions from your health care provider about how to take care of your incision.   Check your incision area every day for signs of infection. Check for:  More redness, swelling, or pain.  More fluid or blood.  Warmth.  Pus or a bad smell.  When you cough or sneeze, hug a pillow. This helps with pain and decreases the chance of your incision opening up (dehiscing). Do this until your incision heals. Medicines  Take over-the-counter and prescription medicines only as told by your health care provider.  For pain management you may alternate between Ibuprofen (as prescribed) and over the counter Tylenol as needed.  For the rash: Continue with topical hydrocortisone cream and benadryl as needed.  If you were prescribed an antibiotic medicine, take it as told by your health care provider. Do not stop taking the antibiotic until it is finished. Driving  Do not drive or operate heavy machinery while taking prescription pain medicine.  Do not drive for 24 hours if you received a sedative. Lifestyle  Do not drink alcohol. This is especially important if you are breastfeeding or taking pain medicine.  Do  not use tobacco products, including cigarettes, chewing tobacco, or e-cigarettes. If you need help quitting, ask your health care provider. Tobacco can delay wound healing. Eating and drinking  Drink at least 8 eight-ounce glasses of water every day unless told not to by your health care provider. If you breastfeed, you may need to drink more water than this.  Eat high-fiber foods every day. These foods may help prevent or relieve constipation. High-fiber foods include:  Whole grain cereals and breads.  Brown rice.  Beans.  Fresh fruits and vegetables. Activity  Return to your normal activities as told by your health care provider. Ask your health care provider what activities are safe for you.  Rest as much as possible. Try to rest or take a nap while your baby is sleeping.  Do not lift anything that is heavier than your baby or 10 lb (4.5 kg) as told by your health care provider.  Ask your health care provider when you can engage in sexual activity. This may depend on your:  Risk of infection.  Healing rate.  Comfort and desire to engage in sexual activity. Bathing  Do not take baths, swim, or use a hot tub until your health care provider approves. Ask your health care provider if you can take showers. You may only be allowed to take sponge baths until your incision heals.  Keep your dressing dry as told by your health care provider. General instructions  Do not use tampons or douches until your health care provider approves.  Wear:  Loose, comfortable clothing.  A supportive and well-fitting bra.  Watch for  any blood clots that may pass from your vagina. These may look like clumps of dark red, brown, or black discharge.  Keep your perineum clean and dry as told by your health care provider.  Wipe from front to back when you use the toilet.  If possible, have someone help you care for your baby and help with household activities for a few days after you leave the  hospital.  Keep all follow-up visits for you and your baby as told by your health care provider. This is important. Contact a health care provider if:  You have:  Bad-smelling vaginal discharge.  Difficulty urinating.  Pain when urinating.  A sudden increase or decrease in the frequency of your bowel movements.  More redness, swelling, or pain around your incision.  More fluid or blood coming from your incision.  Pus or a bad smell coming from your incision.  A fever.  A rash.  Little or no interest in activities you used to enjoy.  Questions about caring for yourself or your baby.  Nausea.  Your incision feels warm to the touch.  Your breasts turn red or become painful or hard.  You feel unusually sad or worried.  You vomit.  You pass large blood clots from your vagina. If you pass a blood clot, save it to show to your health care provider. Do not flush blood clots down the toilet without showing your health care provider.  You urinate more than usual.  You are dizzy or light-headed.  You have not breastfed and have not had a menstrual period for 12 weeks after delivery.  You stopped breastfeeding and have not had a menstrual period for 12 weeks after stopping breastfeeding. Get help right away if:  You have:  Pain that does not go away or get better with medicine.  Chest pain.  Difficulty breathing.  Blurred vision or spots in your vision.  Thoughts about hurting yourself or your baby.  New pain in your abdomen or in one of your legs.  A severe headache.  You faint.  You bleed from your vagina so much that you fill two sanitary pads in one hour. This information is not intended to replace advice given to you by your health care provider. Make sure you discuss any questions you have with your health care provider. Document Released: 05/02/2002 Document Revised: 12/19/2015 Document Reviewed: 07/15/2015 Elsevier Interactive Patient Education   2017 [employer]

## 2016-07-26 NOTE — Progress Notes (Signed)
Postpartum Note Day # 2  S:  Patient resting comfortable in bed.  Pain controlled.  Tolerating general. + flatus, no BM.  Lochia appropriate.  Ambulating without difficulty.  She denies n/v/f/c, SOB, or CP.  Pt plans on breastfeeding.  Overnight pt developed a rash on her abdomen and legs where the scrub was performed.  Doing better s/p benadryl and topical hydrocortisone cream.  O: Temp:  [97.6 F (36.4 C)-98.2 F (36.8 C)] 97.6 F (36.4 C) (12/03 0556) Pulse Rate:  [78-85] 78 (12/03 0556) Resp:  [14-16] 16 (12/03 0556) BP: (99-113)/(68-72) 99/68 (12/03 0556) SpO2:  [99 %] 99 % (12/02 1827)   Gen: A&Ox3, NAD CV: RRR Resp: CTAB Abdomen: soft, NT, ND +BS Slight dermatitis noted on entire abdomen from umbilicus down to hips. Uterus: firm, non-tender, below umbilicus Incision: c/d/i with dermabond Ext: 1+ non-pitting edema, no calf tenderness bilaterally, SCDs in place  Labs:  CBC Latest Ref Rng & Units 07/25/2016 07/22/2016  WBC 4.0 - 10.5 K/uL 9.5 7.4  Hemoglobin 12.0 - 15.0 g/dL 0.9(W9.6(L) 11.4(L)  Hematocrit 36.0 - 46.0 % 28.5(L) 34.5(L)  Platelets 150 - 400 K/uL 164 186     A/P: Pt is a 35 y.o. [id number] s/p Repeat C-section with tubal ligation, POD#2  - Pain well controlled -GU: UOP is adequate -GI: Tolerating general diet -Activity: encouraged sitting up to chair and ambulation as tolerated -Prophylaxis: SCDs in place, encourage ambulation -Contact dermatitis: continue with benadryl and topical hydrocortisone as needed  -Labs: Hgb appropriate for recent surgery, pt asymptomatic, iron daily -Hypothyroidism: continue home medication q am -Baby Boy Circ- will not complete until baby is seen by urology as an outpatient  DISPO: Continue with routine postpartum care.  Possible discharge home later today.  Myna HidalgoJennifer Asti [place], [place] [phone] (pager) [phone] (office)

## 2018-11-08 ENCOUNTER — Ambulatory Visit: Payer: BLUE CROSS/BLUE SHIELD | Admitting: Physician Assistant

## 2018-11-17 ENCOUNTER — Ambulatory Visit: Payer: BLUE CROSS/BLUE SHIELD | Admitting: Family Medicine

## 2019-12-20 ENCOUNTER — Other Ambulatory Visit: Payer: Self-pay | Admitting: Obstetrics & Gynecology

## 2019-12-20 DIAGNOSIS — N63 Unspecified lump in unspecified breast: Secondary | ICD-10-CM

## 2020-01-09 ENCOUNTER — Other Ambulatory Visit: Payer: Self-pay

## 2020-01-09 ENCOUNTER — Other Ambulatory Visit: Payer: Self-pay | Admitting: Obstetrics & Gynecology

## 2020-01-09 ENCOUNTER — Ambulatory Visit
Admission: RE | Admit: 2020-01-09 | Discharge: 2020-01-09 | Disposition: A | Discharge status: Home / Self Care | Payer: BC Managed Care – PPO | Source: Ambulatory Visit | Attending: Obstetrics & Gynecology | Admitting: Obstetrics & Gynecology

## 2020-01-09 DIAGNOSIS — N63 Unspecified lump in unspecified breast: Secondary | ICD-10-CM

## 2020-01-09 DIAGNOSIS — N6489 Other specified disorders of breast: Secondary | ICD-10-CM

## 2020-08-15 ENCOUNTER — Other Ambulatory Visit: Payer: Self-pay

## 2020-08-15 ENCOUNTER — Other Ambulatory Visit: Payer: Self-pay | Admitting: Obstetrics & Gynecology

## 2020-08-15 ENCOUNTER — Ambulatory Visit
Admission: RE | Admit: 2020-08-15 | Discharge: 2020-08-15 | Disposition: A | Discharge status: Home / Self Care | Payer: BC Managed Care – PPO | Source: Ambulatory Visit | Attending: Obstetrics & Gynecology | Admitting: Obstetrics & Gynecology

## 2020-08-15 DIAGNOSIS — N6489 Other specified disorders of breast: Secondary | ICD-10-CM

## 2021-02-14 ENCOUNTER — Other Ambulatory Visit: Payer: Self-pay

## 2021-02-14 ENCOUNTER — Ambulatory Visit
Admission: RE | Admit: 2021-02-14 | Discharge: 2021-02-14 | Disposition: A | Discharge status: Home / Self Care | Payer: BC Managed Care – PPO | Source: Ambulatory Visit | Attending: Obstetrics & Gynecology | Admitting: Obstetrics & Gynecology

## 2021-02-14 DIAGNOSIS — N6489 Other specified disorders of breast: Secondary | ICD-10-CM

## 2021-08-21 IMAGING — US US BREAST*L* LIMITED INC AXILLA
1 series · 9 of 9 positions shown · non-contrast
Comparison: Previous exams.

CLINICAL DATA: Short-term follow-up for probably benign left breast
asymmetry.

EXAM:
DIGITAL DIAGNOSTIC UNILATERAL LEFT MAMMOGRAM WITH TOMO AND CAD;
ULTRASOUND LEFT BREAST LIMITED

[Series 1: us breast*left* limited inc axilla · 0.06mm/px · 9 of 9 slices shown]
[im 1/9]
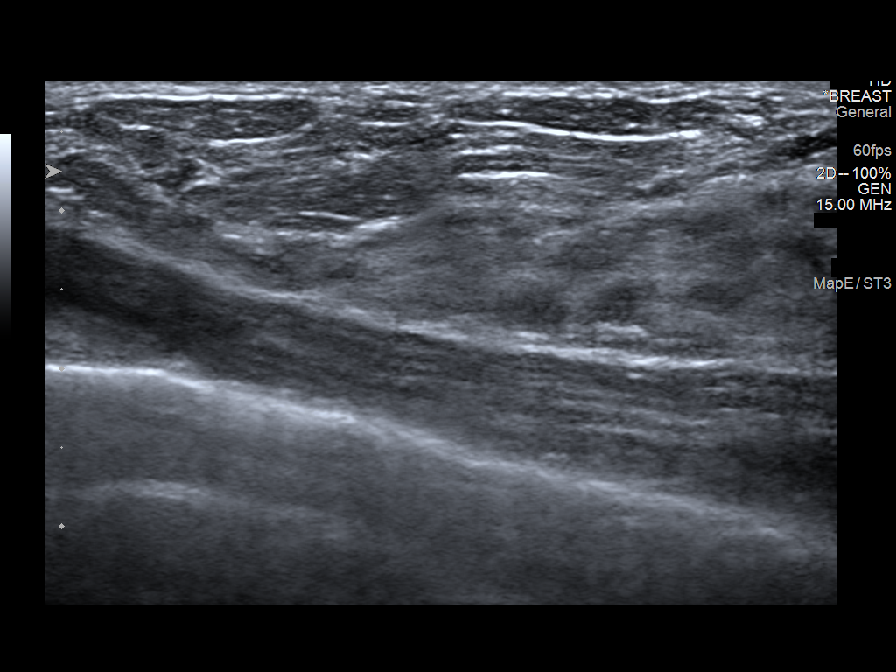
[im 2/9]
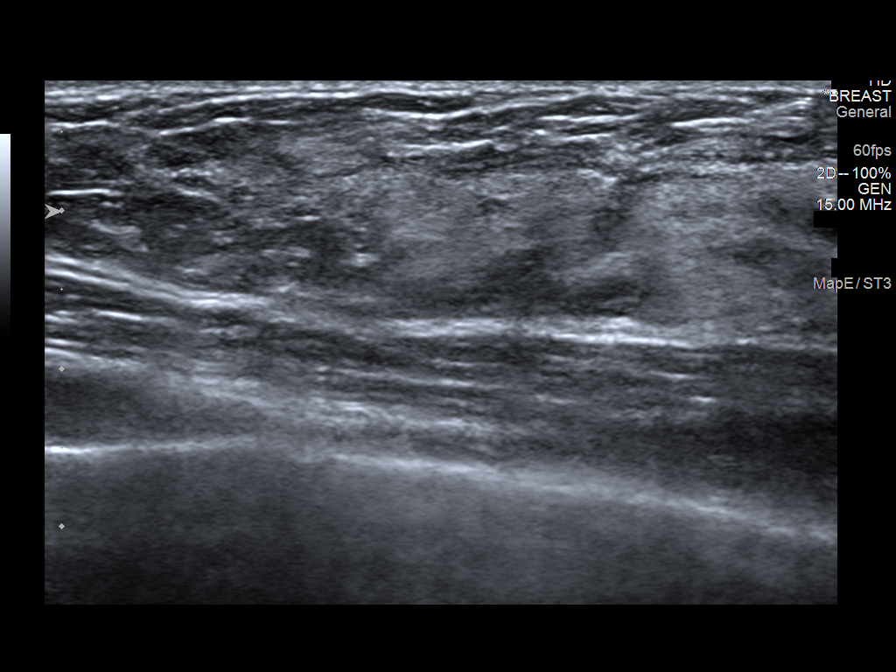
[im 3/9]
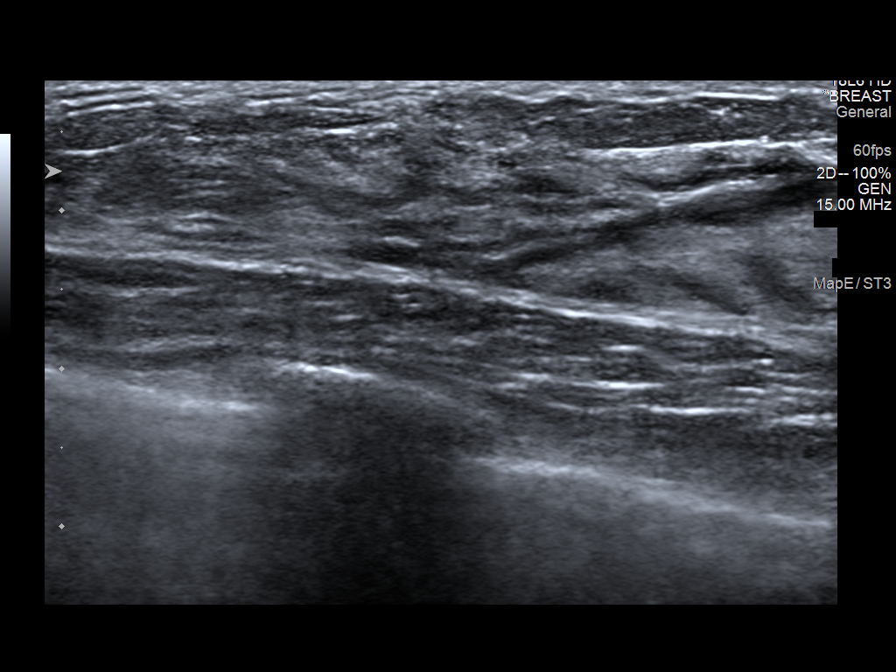
[im 4/9]
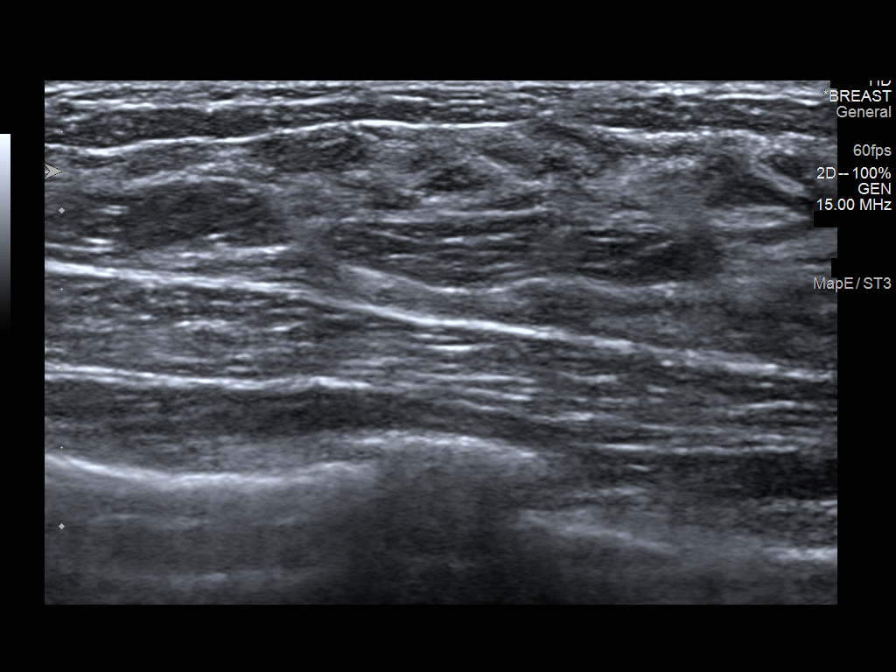
[im 5/9]
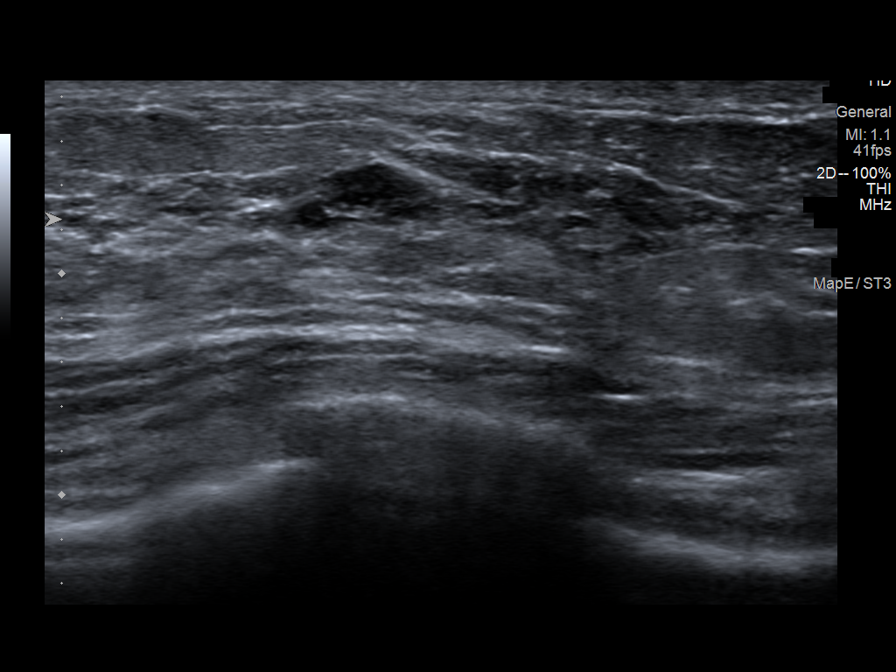
[im 6/9]
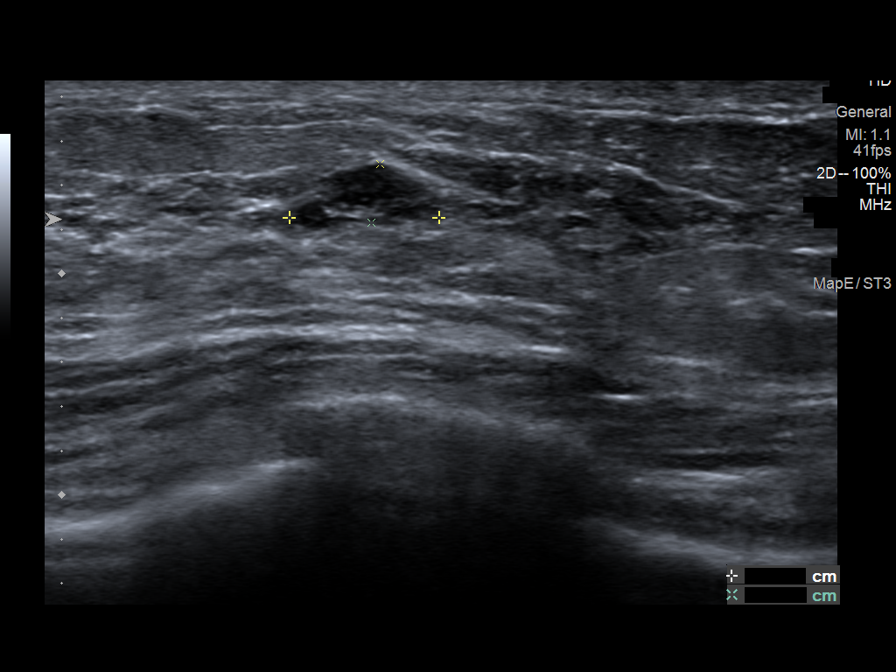
[im 7/9]
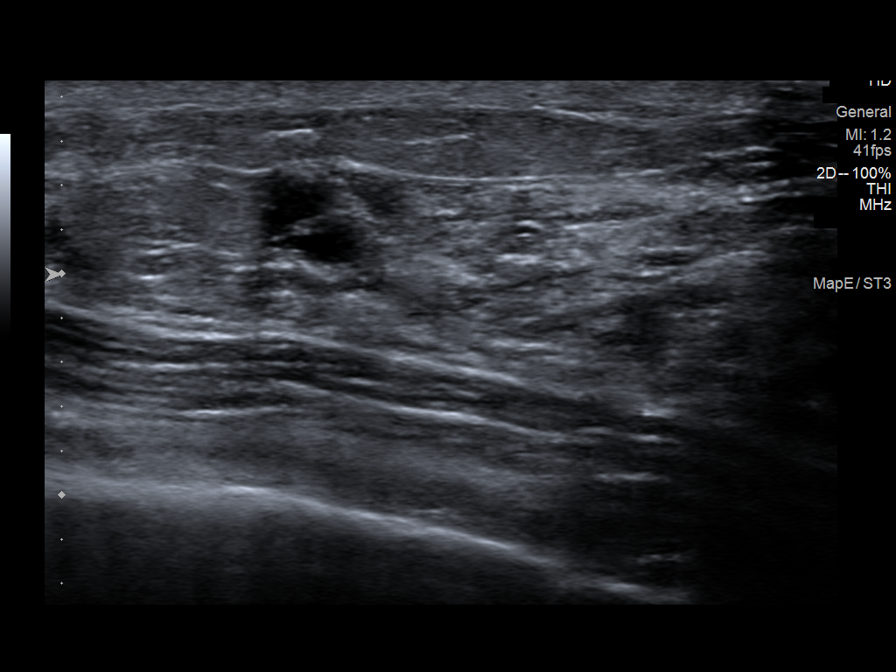
[im 8/9]
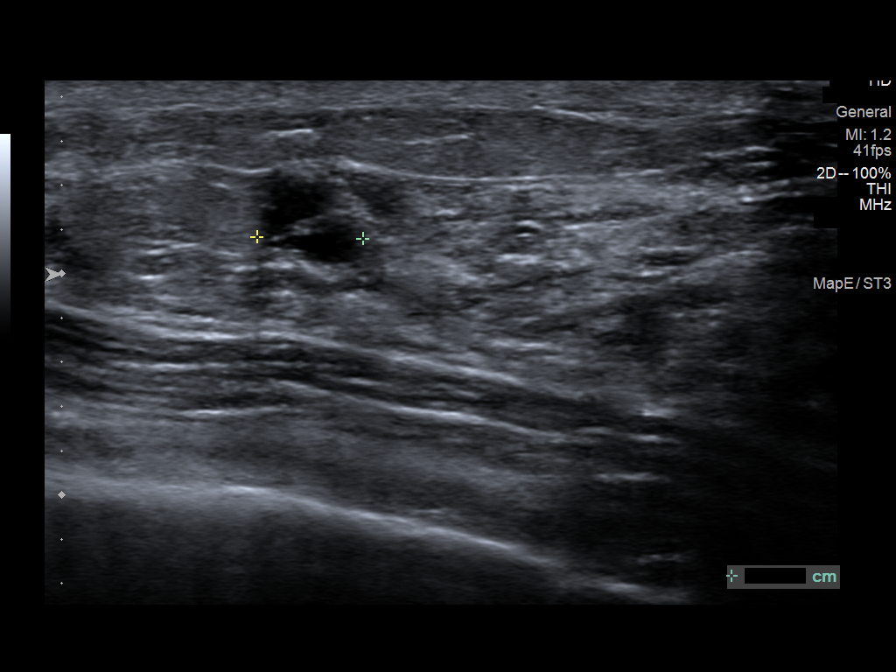
[im 9/9]
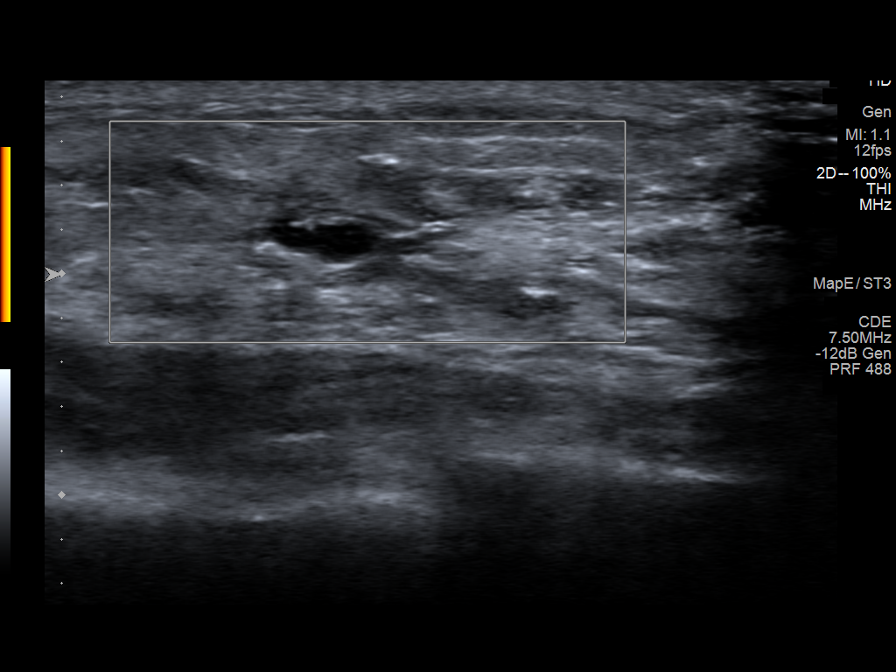

[9 of 9 positions shown; findings below may reference images not displayed]

ACR Breast Density Category d: The breast tissue is extremely dense,
which lowers the sensitivity of mammography.
FINDINGS: No suspicious masses or calcifications are seen in the left breast.
The asymmetry in the far medial posterior left breast seen on the cc
view only appears unchanged.

Mammographic images were processed with CAD.

Targeted ultrasound of the inner left breast was performed. There is
a cluster of mildly complicated cysts at 9 o'clock 3 cm from nipple
measuring 0.7 x 0.3 x 0.5 cm. This may correspond with the asymmetry
seen in the inner left breast at mammography. No suspicious masses
or abnormality seen in the inner left breast.
IMPRESSION: Stable appearance of probably benign asymmetry in the inner left
breast.

RECOMMENDATION:
Bilateral diagnostic mammography in 6 months with possible left
breast ultrasound which will demonstrate 1 year of stability of the
probably benign left breast asymmetry.

I have discussed the findings and recommendations with the patient.
If applicable, a reminder letter will be sent to the patient
regarding the next appointment.

BI-RADS CATEGORY  3: Probably benign.

## 2021-08-21 IMAGING — MG MM DIGITAL DIAGNOSTIC UNILAT*L* W/ TOMO W/ CAD
6 series · 6 of 18 positions shown · non-contrast
Comparison: Previous exams.

CLINICAL DATA: Short-term follow-up for probably benign left breast
asymmetry.

EXAM:
DIGITAL DIAGNOSTIC UNILATERAL LEFT MAMMOGRAM WITH TOMO AND CAD;
ULTRASOUND LEFT BREAST LIMITED

[L MLO synth-2D]
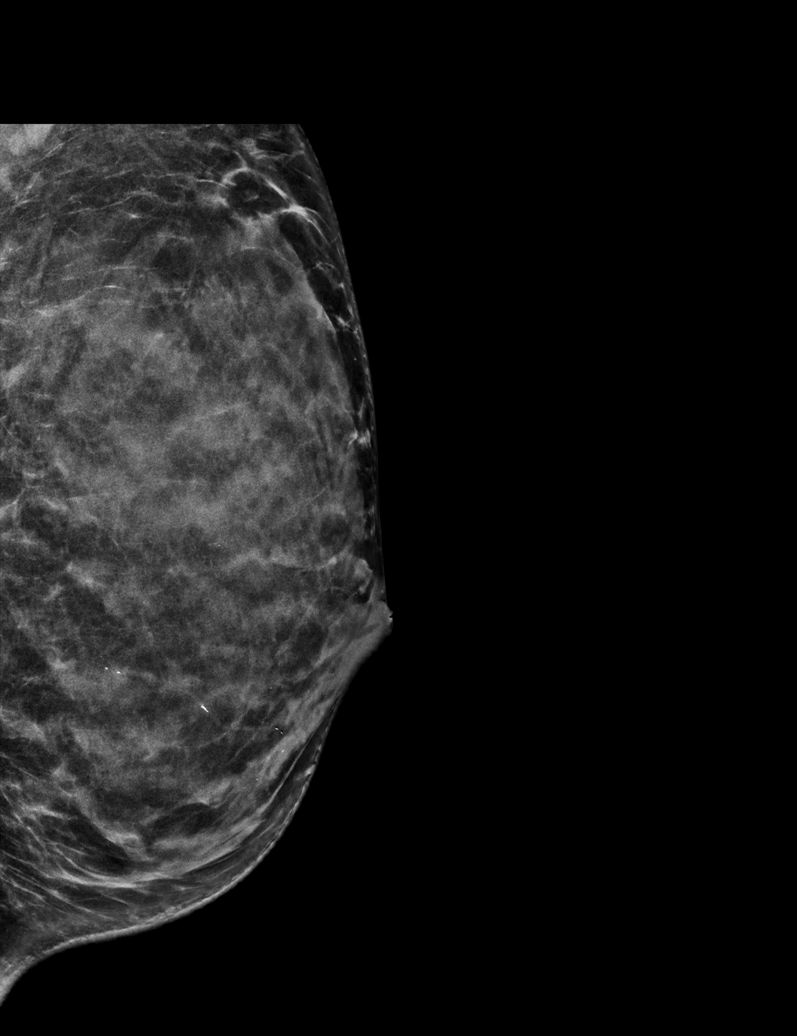

[L CC synth-2D (1 of 2)]
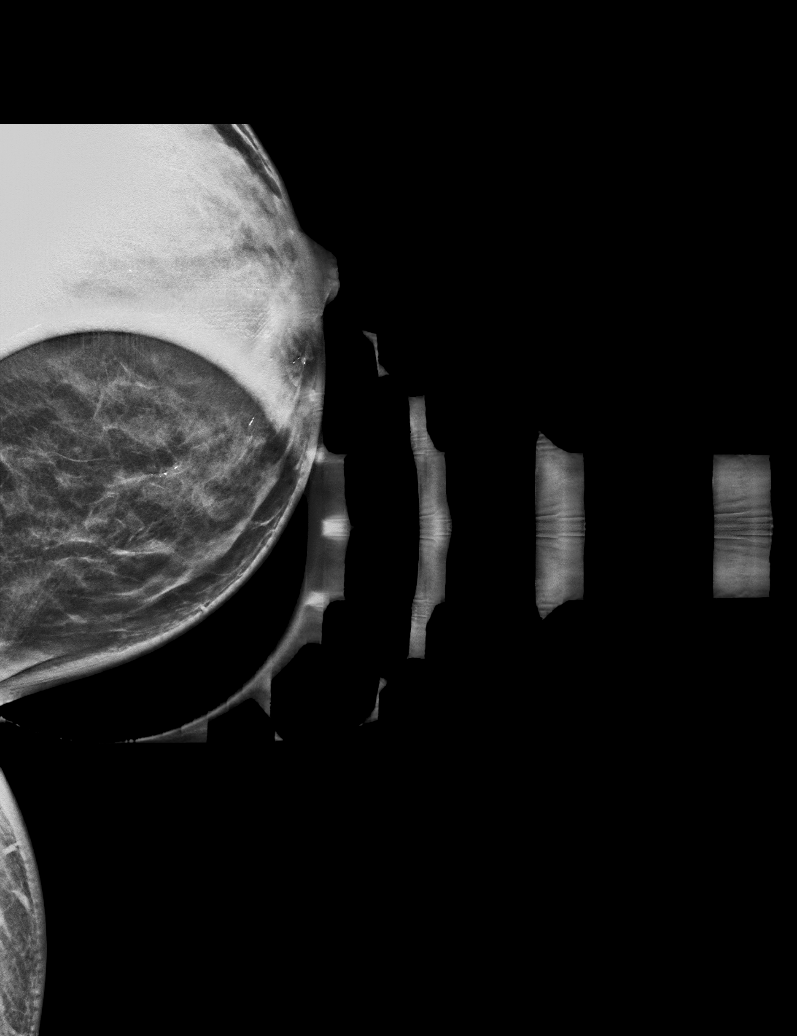

[L CC synth-2D (2 of 2)]
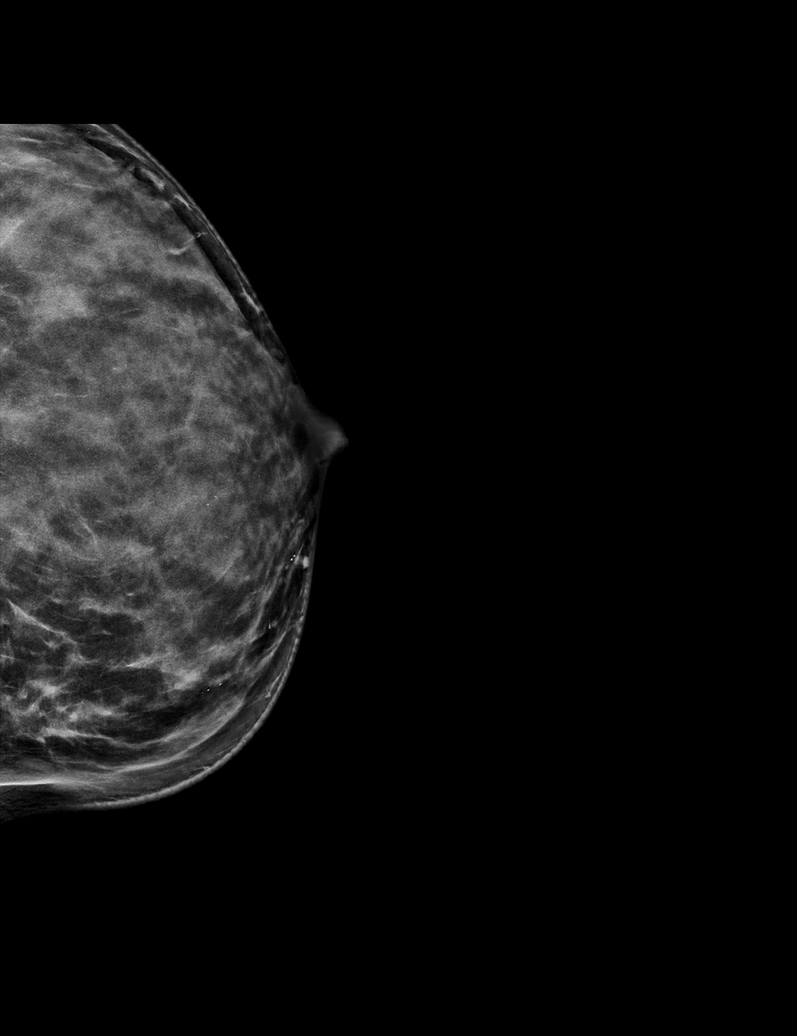

[L CC tomo (1 of 2) · tomo slice 31/61.0]
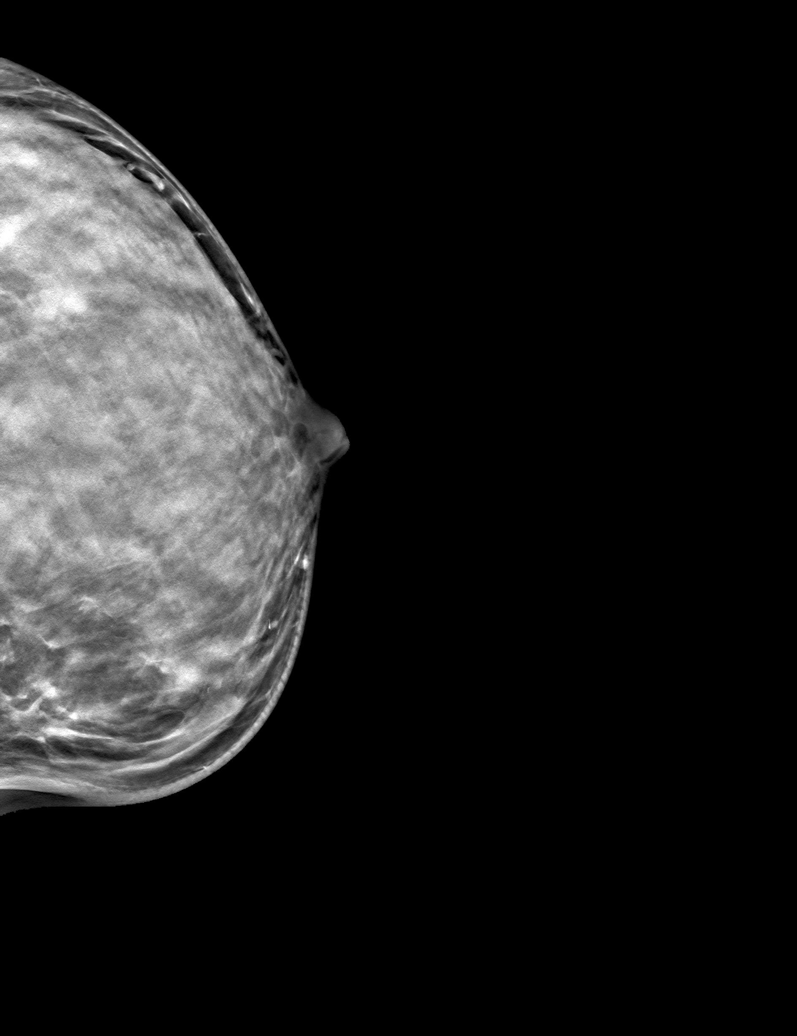

[L CC tomo (2 of 2) · tomo slice 21/42.0]
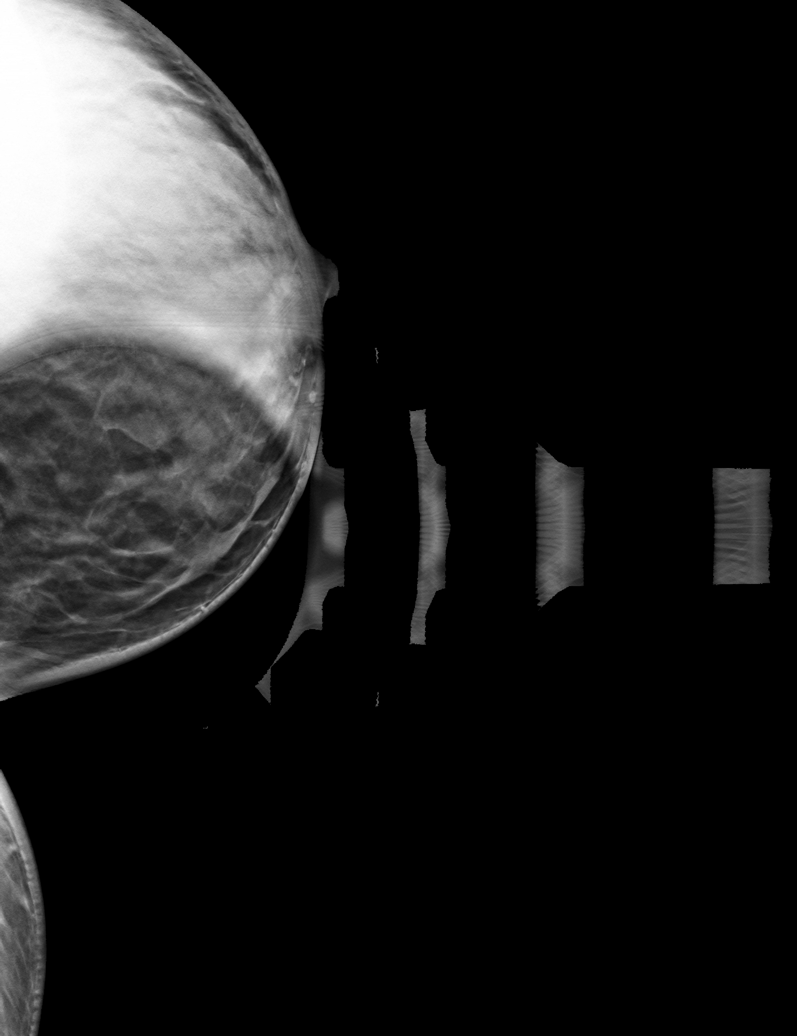

[L MLO tomo · tomo slice 29/58.0]
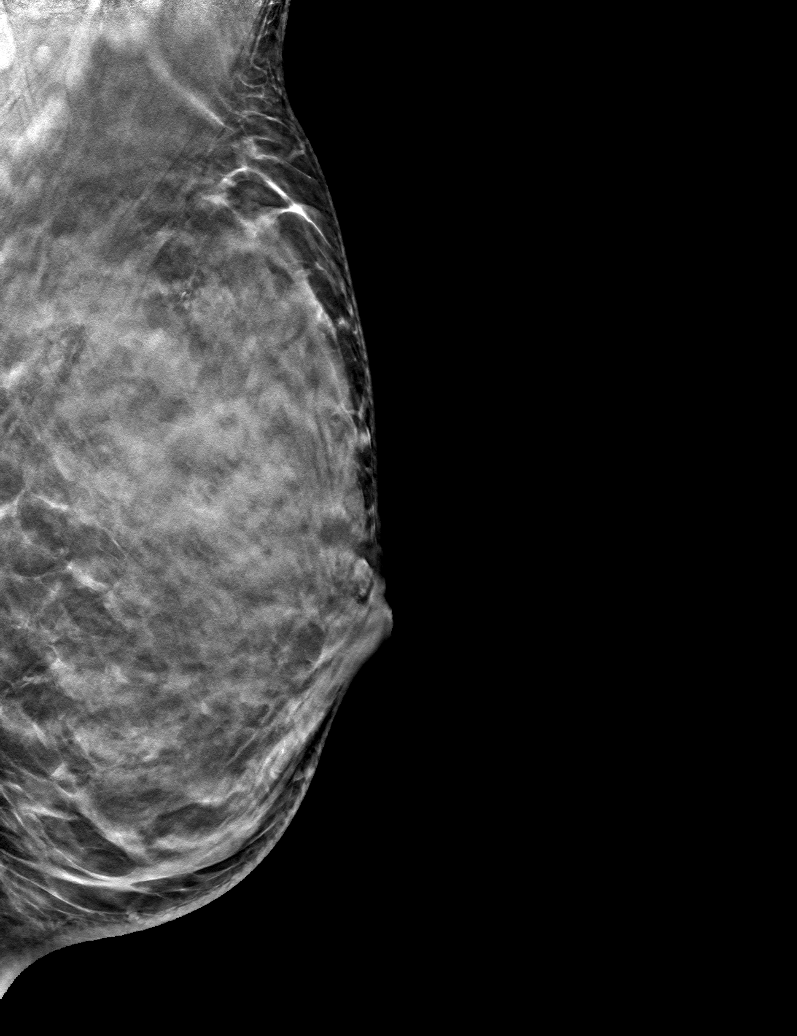

[6 of 18 positions shown; findings below may reference images not displayed]

ACR Breast Density Category d: The breast tissue is extremely dense,
which lowers the sensitivity of mammography.
FINDINGS: No suspicious masses or calcifications are seen in the left breast.
The asymmetry in the far medial posterior left breast seen on the cc
view only appears unchanged.

Mammographic images were processed with CAD.

Targeted ultrasound of the inner left breast was performed. There is
a cluster of mildly complicated cysts at 9 o'clock 3 cm from nipple
measuring 0.7 x 0.3 x 0.5 cm. This may correspond with the asymmetry
seen in the inner left breast at mammography. No suspicious masses
or abnormality seen in the inner left breast.
IMPRESSION: Stable appearance of probably benign asymmetry in the inner left
breast.

RECOMMENDATION:
Bilateral diagnostic mammography in 6 months with possible left
breast ultrasound which will demonstrate 1 year of stability of the
probably benign left breast asymmetry.

I have discussed the findings and recommendations with the patient.
If applicable, a reminder letter will be sent to the patient
regarding the next appointment.

BI-RADS CATEGORY  3: Probably benign.

## 2022-02-20 IMAGING — US US BREAST*L* LIMITED INC AXILLA
1 series · 6 of 6 positions shown · non-contrast
Comparison: Previous exam(s).

CLINICAL DATA: One year follow-up of probable benign asymmetry in
the medial aspect of the left breast.

EXAM:
DIGITAL DIAGNOSTIC BILATERAL MAMMOGRAM WITH TOMOSYNTHESIS AND CAD;
ULTRASOUND LEFT BREAST LIMITED
TECHNIQUE: Bilateral digital diagnostic mammography and breast tomosynthesis
was performed. The images were evaluated with computer-aided
detection.; Targeted ultrasound examination of the left breast was
performed

[Series 1: us breast*left* limited inc axilla · 0.05mm/px · 6 of 6 slices shown]
[im 1/6]
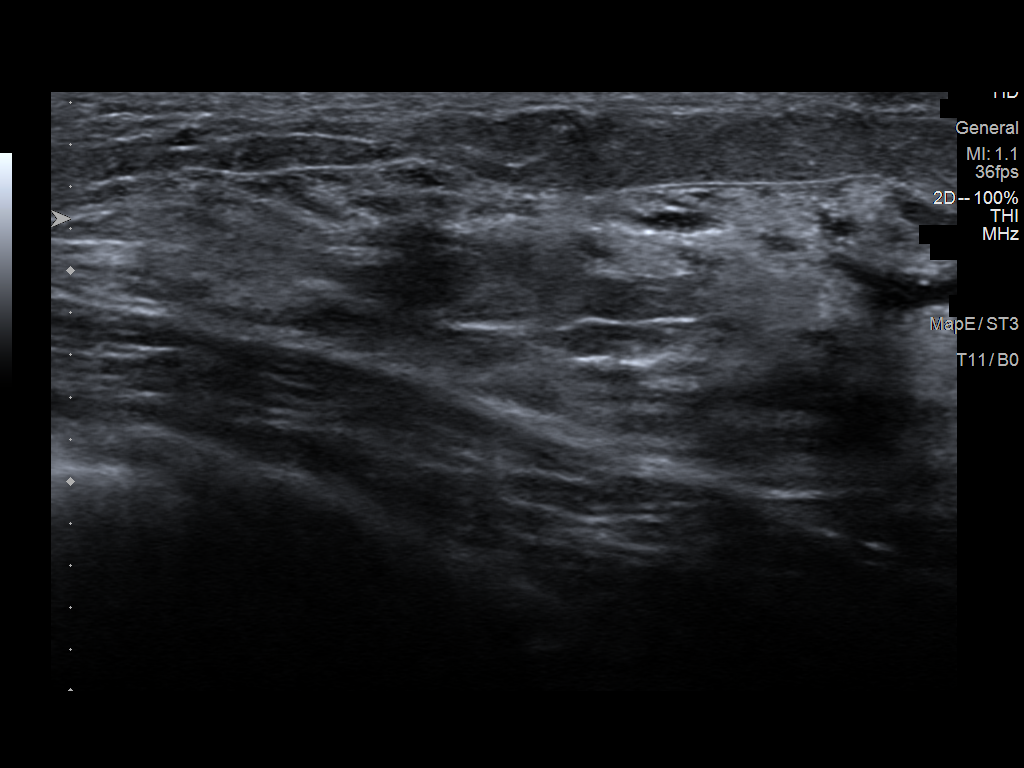
[im 2/6]
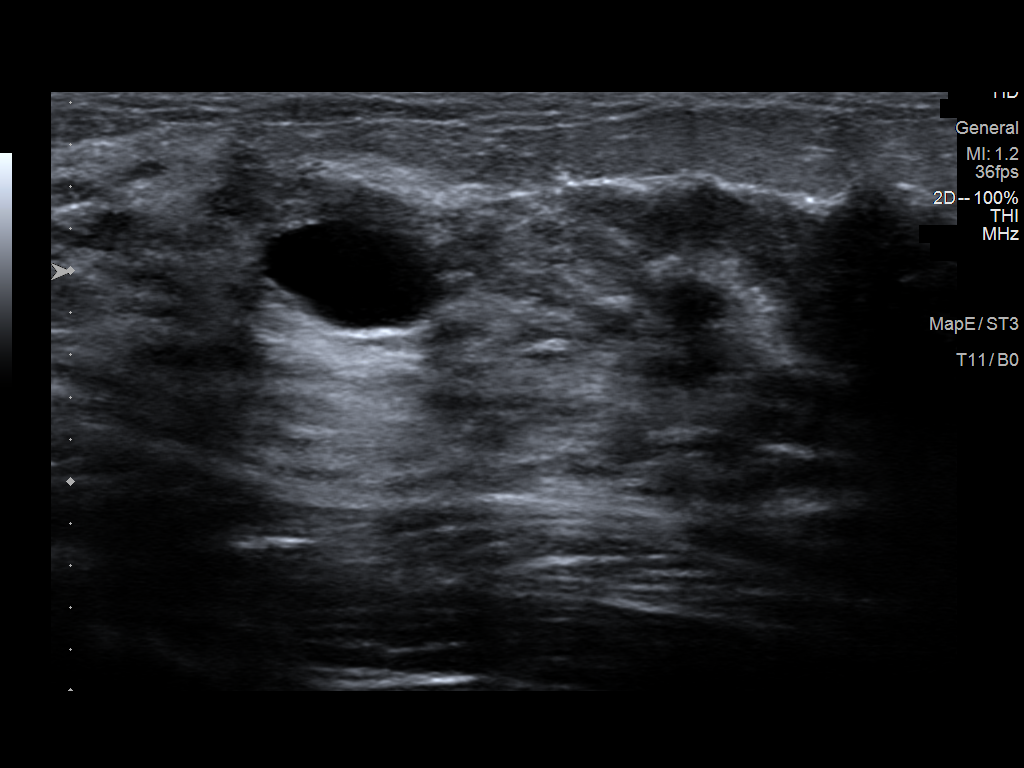
[im 3/6]
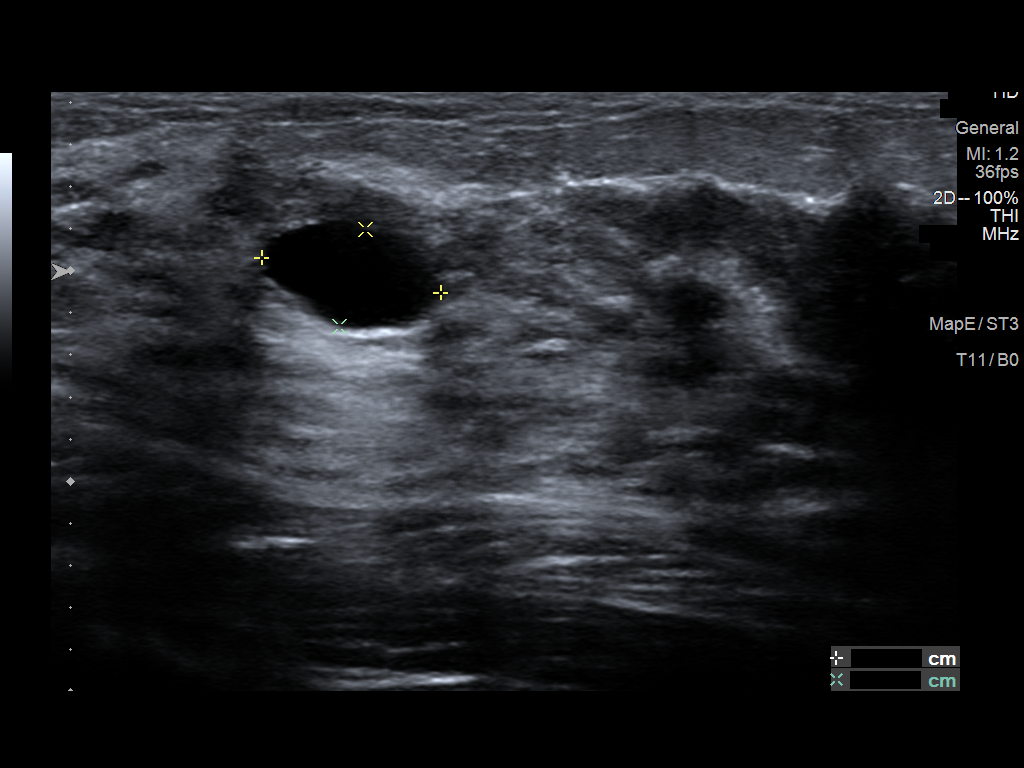
[im 4/6]
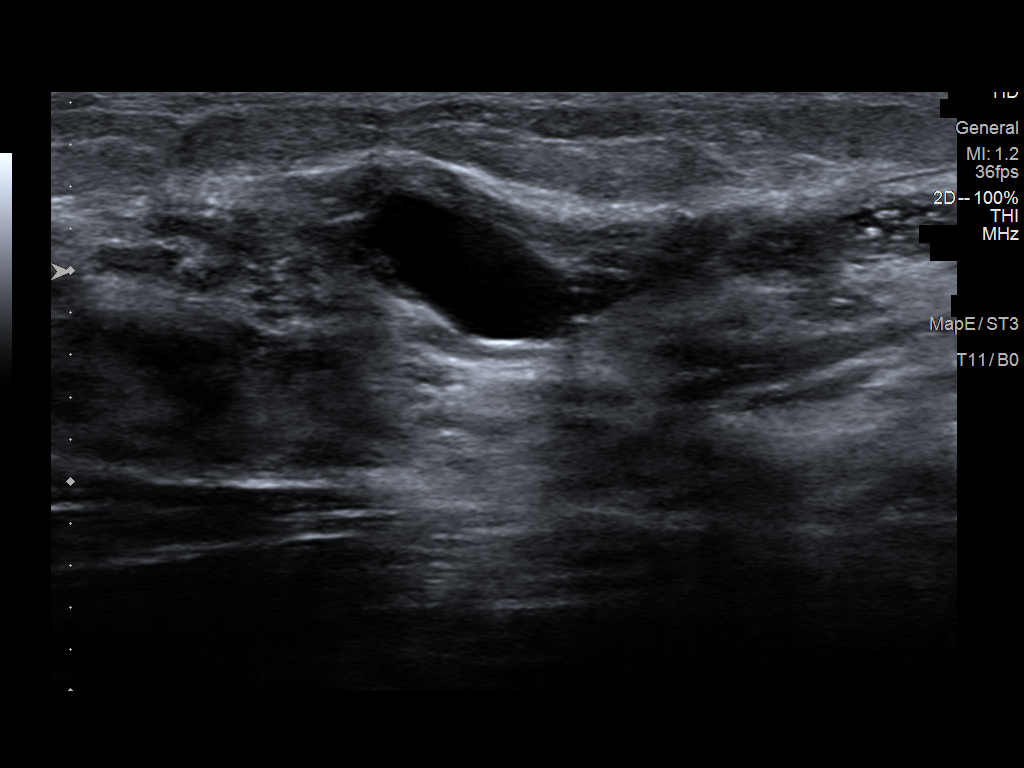
[im 5/6]
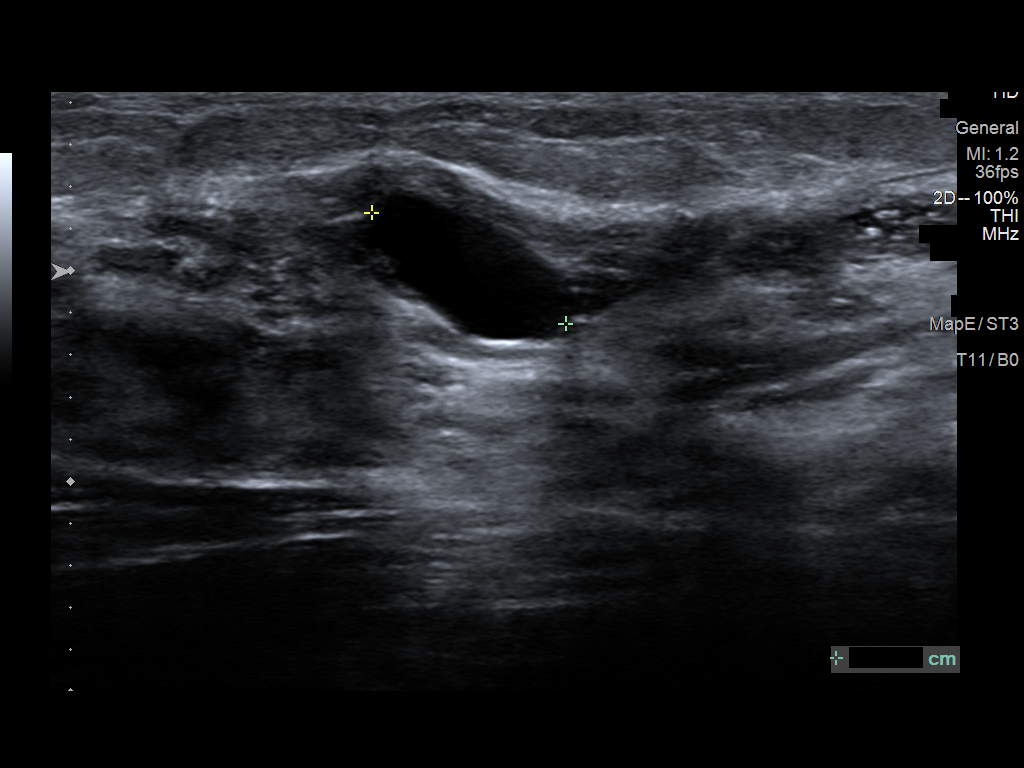
[im 6/6]
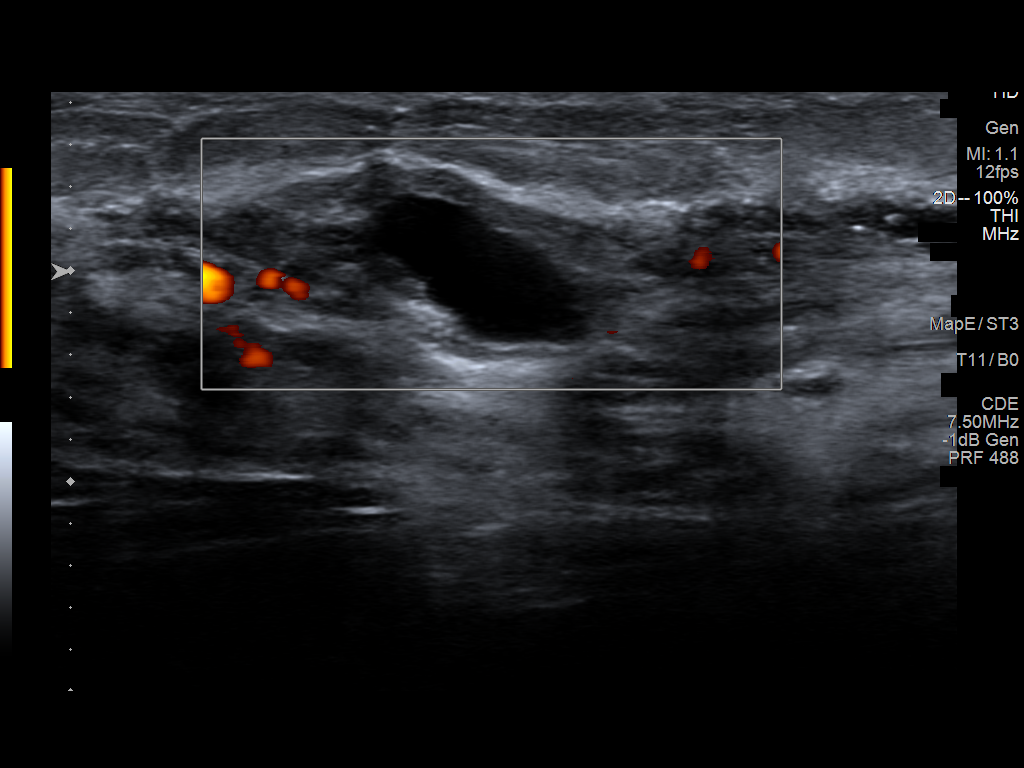

[6 of 6 positions shown; findings below may reference images not displayed]

ACR Breast Density Category d: The breast tissue is extremely dense,
which lowers the sensitivity of mammography.
FINDINGS: The asymmetry in the medial aspect of the left breast is stable
compared to the prior exams. No suspicious mass or malignant type
microcalcifications identified.

Targeted ultrasound is performed, showing no abnormality is seen in
the 9 o'clock region of the left breast 3-4 cm from the nipple.
Previously seen clustered cysts are no longer visualized. A benign
anechoic cyst is seen in the 11 o'clock 1 cm from the nipple
measuring 9 x 5 x 11 mm.
IMPRESSION: Probable benign asymmetry in the medial aspect of the left breast.

RECOMMENDATION:
Bilateral diagnostic mammogram in 1 year is recommended.

I have discussed the findings and recommendations with the patient.
If applicable, a reminder letter will be sent to the patient
regarding the next appointment.

BI-RADS CATEGORY  3: Probably benign.

## 2022-05-18 ENCOUNTER — Other Ambulatory Visit: Payer: Self-pay | Admitting: Obstetrics & Gynecology

## 2022-05-18 DIAGNOSIS — N6489 Other specified disorders of breast: Secondary | ICD-10-CM

## 2022-05-18 DIAGNOSIS — Z1231 Encounter for screening mammogram for malignant neoplasm of breast: Secondary | ICD-10-CM

## 2022-06-26 ENCOUNTER — Ambulatory Visit
Admission: RE | Admit: 2022-06-26 | Discharge: 2022-06-26 | Disposition: A | Discharge status: Home / Self Care | Payer: BC Managed Care – PPO | Source: Ambulatory Visit | Attending: Obstetrics & Gynecology | Admitting: Obstetrics & Gynecology

## 2022-06-26 DIAGNOSIS — N6489 Other specified disorders of breast: Secondary | ICD-10-CM

## 2022-06-26 DIAGNOSIS — Z1231 Encounter for screening mammogram for malignant neoplasm of breast: Secondary | ICD-10-CM
